# Patient Record
Sex: Male | Born: 1985 | Race: White | Hispanic: No | Marital: Single | Smoking: Former smoker
Health system: Southern US, Community
[De-identification: ages and names within clinical notes are randomized; demographics above are authoritative.]

## PROBLEM LIST (undated history)

## (undated) DIAGNOSIS — J45909 Unspecified asthma, uncomplicated: Secondary | ICD-10-CM

## (undated) DIAGNOSIS — J189 Pneumonia, unspecified organism: Secondary | ICD-10-CM

## (undated) HISTORY — PX: TONSILLECTOMY: SUR1361

## (undated) HISTORY — PX: HERNIA REPAIR: SHX51

## (undated) HISTORY — PX: JOINT REPLACEMENT: SHX530

## (undated) HISTORY — PX: EYE SURGERY: SHX253

---
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
## 2005-08-09 ENCOUNTER — Ambulatory Visit (HOSPITAL_COMMUNITY): Admission: RE | Admit: 2005-08-09 | Discharge: 2005-08-09 | Payer: Self-pay | Admitting: Neurology

## 2008-05-08 ENCOUNTER — Emergency Department (HOSPITAL_COMMUNITY): Admission: EM | Admit: 2008-05-08 | Discharge: 2008-05-08 | Payer: Self-pay | Admitting: Emergency Medicine

## 2010-06-21 ENCOUNTER — Encounter: Admission: RE | Admit: 2010-06-21 | Discharge: 2010-06-21 | Payer: Self-pay | Admitting: Orthopedic Surgery

## 2014-12-01 ENCOUNTER — Emergency Department (HOSPITAL_COMMUNITY): Payer: Managed Care, Other (non HMO)

## 2014-12-01 ENCOUNTER — Encounter (HOSPITAL_COMMUNITY): Payer: Self-pay | Admitting: Emergency Medicine

## 2014-12-01 ENCOUNTER — Inpatient Hospital Stay (HOSPITAL_COMMUNITY)
Admission: EM | Admit: 2014-12-01 | Discharge: 2014-12-06 | DRG: 871 | Disposition: A | Discharge status: Home / Self Care | Payer: Managed Care, Other (non HMO) | Attending: Internal Medicine | Admitting: Internal Medicine

## 2014-12-01 DIAGNOSIS — E871 Hypo-osmolality and hyponatremia: Secondary | ICD-10-CM | POA: Diagnosis present

## 2014-12-01 DIAGNOSIS — J189 Pneumonia, unspecified organism: Secondary | ICD-10-CM | POA: Diagnosis present

## 2014-12-01 DIAGNOSIS — A419 Sepsis, unspecified organism: Secondary | ICD-10-CM | POA: Diagnosis not present

## 2014-12-01 DIAGNOSIS — R0600 Dyspnea, unspecified: Secondary | ICD-10-CM | POA: Diagnosis not present

## 2014-12-01 DIAGNOSIS — R6510 Systemic inflammatory response syndrome (SIRS) of non-infectious origin without acute organ dysfunction: Secondary | ICD-10-CM | POA: Diagnosis present

## 2014-12-01 DIAGNOSIS — E876 Hypokalemia: Secondary | ICD-10-CM | POA: Diagnosis present

## 2014-12-01 DIAGNOSIS — H109 Unspecified conjunctivitis: Secondary | ICD-10-CM | POA: Diagnosis present

## 2014-12-01 DIAGNOSIS — E860 Dehydration: Secondary | ICD-10-CM | POA: Diagnosis present

## 2014-12-01 DIAGNOSIS — F17210 Nicotine dependence, cigarettes, uncomplicated: Secondary | ICD-10-CM | POA: Diagnosis present

## 2014-12-01 DIAGNOSIS — R000 Tachycardia, unspecified: Secondary | ICD-10-CM

## 2014-12-01 DIAGNOSIS — R0602 Shortness of breath: Secondary | ICD-10-CM

## 2014-12-01 HISTORY — DX: Unspecified asthma, uncomplicated: J45.909

## 2014-12-01 HISTORY — DX: Pneumonia, unspecified organism: J18.9

## 2014-12-01 LAB — BASIC METABOLIC PANEL
ANION GAP: 19 — AB (ref 5–15)
BUN: 11 mg/dL (ref 6–23)
CO2: 20 meq/L (ref 19–32)
CREATININE: 1.1 mg/dL (ref 0.50–1.35)
Calcium: 9.5 mg/dL (ref 8.4–10.5)
Chloride: 96 mEq/L (ref 96–112)
GFR calc non Af Amer: >90 mL/min (ref >90)
Glucose, Bld: 98 mg/dL (ref 70–99)
Potassium: 3.6 mEq/L — ABNORMAL LOW (ref 3.7–5.3)
Sodium: 135 mEq/L — ABNORMAL LOW (ref 137–147)

## 2014-12-01 LAB — URINALYSIS, ROUTINE W REFLEX MICROSCOPIC
BILIRUBIN URINE: NEGATIVE
Glucose, UA: NEGATIVE mg/dL
Hgb urine dipstick: NEGATIVE
Ketones, ur: 15 mg/dL — AB
LEUKOCYTES UA: NEGATIVE
NITRITE: NEGATIVE
PH: 5.5 (ref 5.0–8.0)
Protein, ur: 30 mg/dL — AB
UROBILINOGEN UA: 1 mg/dL (ref 0.0–1.0)

## 2014-12-01 LAB — CBC WITH DIFFERENTIAL/PLATELET
BASOS PCT: 0 % (ref 0–1)
Basophils Absolute: 0 10*3/uL (ref 0.0–0.1)
Eosinophils Absolute: 0 10*3/uL (ref 0.0–0.7)
Eosinophils Relative: 0 % (ref 0–5)
HCT: 46.4 % (ref 39.0–52.0)
HEMOGLOBIN: 16 g/dL (ref 13.0–17.0)
Lymphocytes Relative: 15 % (ref 12–46)
Lymphs Abs: 0.8 10*3/uL (ref 0.7–4.0)
MCH: 28.5 pg (ref 26.0–34.0)
MCHC: 34.5 g/dL (ref 30.0–36.0)
MCV: 82.6 fL (ref 78.0–100.0)
MONO ABS: 0.5 10*3/uL (ref 0.1–1.0)
MONOS PCT: 10 % (ref 3–12)
NEUTROS PCT: 75 % (ref 43–77)
Neutro Abs: 3.9 10*3/uL (ref 1.7–7.7)
Platelets: 251 10*3/uL (ref 150–400)
RBC: 5.62 MIL/uL (ref 4.22–5.81)
RDW: 13.1 % (ref 11.5–15.5)
WBC: 5.3 10*3/uL (ref 4.0–10.5)

## 2014-12-01 LAB — RAPID HIV SCREEN (WH-MAU): SUDS RAPID HIV SCREEN: NONREACTIVE

## 2014-12-01 LAB — PROCALCITONIN: PROCALCITONIN: 0.16 ng/mL

## 2014-12-01 LAB — PRO B NATRIURETIC PEPTIDE: Pro B Natriuretic peptide (BNP): 14.6 pg/mL (ref 0–125)

## 2014-12-01 LAB — URINE MICROSCOPIC-ADD ON

## 2014-12-01 LAB — HIV ANTIBODY (ROUTINE TESTING W REFLEX): HIV 1&2 Ab, 4th Generation: NONREACTIVE

## 2014-12-01 LAB — I-STAT CG4 LACTIC ACID, ED: Lactic Acid, Venous: 1.51 mmol/L (ref 0.5–2.2)

## 2014-12-01 MED ORDER — LACTATED RINGERS IV SOLN: INTRAVENOUS | Status: DC

## 2014-12-01 MED ORDER — ONDANSETRON HCL 4 MG/2ML IJ SOLN
4.0000 mg | Freq: Once | INTRAMUSCULAR | Status: AC
  Administered 2014-12-01: 4 mg via INTRAVENOUS
  Filled 2014-12-01: qty 2

## 2014-12-01 MED ORDER — ENOXAPARIN SODIUM 40 MG/0.4ML ~~LOC~~ SOLN
40.0000 mg | SUBCUTANEOUS | Status: DC
  Administered 2014-12-01 – 2014-12-04 (×4): 40 mg via SUBCUTANEOUS
  Filled 2014-12-01 (×5): qty 0.4

## 2014-12-01 MED ORDER — POTASSIUM CHLORIDE CRYS ER 20 MEQ PO TBCR
40.0000 meq | EXTENDED_RELEASE_TABLET | Freq: Once | ORAL | Status: AC
  Administered 2014-12-01: 40 meq via ORAL
  Filled 2014-12-01: qty 2

## 2014-12-01 MED ORDER — DEXTROSE 5 % IV SOLN
500.0000 mg | INTRAVENOUS | Status: DC
  Administered 2014-12-02 – 2014-12-05 (×4): 500 mg via INTRAVENOUS
  Filled 2014-12-01 (×5): qty 500

## 2014-12-01 MED ORDER — CEFTRIAXONE SODIUM IN DEXTROSE 20 MG/ML IV SOLN
1.0000 g | INTRAVENOUS | Status: DC
  Administered 2014-12-02 – 2014-12-05 (×4): 1 g via INTRAVENOUS
  Filled 2014-12-01 (×5): qty 50

## 2014-12-01 MED ORDER — GUAIFENESIN-DM 100-10 MG/5ML PO SYRP
5.0000 mL | ORAL_SOLUTION | ORAL | Status: DC | PRN
  Administered 2014-12-02 – 2014-12-05 (×11): 5 mL via ORAL
  Filled 2014-12-01 (×12): qty 10

## 2014-12-01 MED ORDER — IPRATROPIUM BROMIDE 0.02 % IN SOLN
0.5000 mg | RESPIRATORY_TRACT | Status: DC | PRN
Start: 2014-12-01 — End: 2014-12-05

## 2014-12-01 MED ORDER — BENZONATATE 100 MG PO CAPS
200.0000 mg | ORAL_CAPSULE | Freq: Three times a day (TID) | ORAL | Status: DC
  Administered 2014-12-01 – 2014-12-06 (×14): 200 mg via ORAL
  Filled 2014-12-01 (×21): qty 2

## 2014-12-01 MED ORDER — MENTHOL 3 MG MT LOZG
1.0000 | LOZENGE | OROMUCOSAL | Status: DC | PRN
  Filled 2014-12-01 (×4): qty 9

## 2014-12-01 MED ORDER — LEVALBUTEROL HCL 0.63 MG/3ML IN NEBU
0.6300 mg | INHALATION_SOLUTION | RESPIRATORY_TRACT | Status: DC | PRN
  Administered 2014-12-04: 0.63 mg via RESPIRATORY_TRACT
  Filled 2014-12-01: qty 3

## 2014-12-01 MED ORDER — SODIUM CHLORIDE 0.9 % IV BOLUS (SEPSIS)
1000.0000 mL | Freq: Once | INTRAVENOUS | Status: AC
  Administered 2014-12-01: 1000 mL via INTRAVENOUS

## 2014-12-01 MED ORDER — ACETAMINOPHEN 325 MG PO TABS
650.0000 mg | ORAL_TABLET | Freq: Four times a day (QID) | ORAL | Status: DC | PRN
  Administered 2014-12-01: 650 mg via ORAL
  Filled 2014-12-01: qty 2

## 2014-12-01 MED ORDER — SODIUM CHLORIDE 0.9 % IV SOLN
INTRAVENOUS | Status: DC
  Administered 2014-12-01 – 2014-12-05 (×6): via INTRAVENOUS

## 2014-12-01 MED ORDER — AZITHROMYCIN 250 MG PO TABS
500.0000 mg | ORAL_TABLET | Freq: Once | ORAL | Status: AC
  Administered 2014-12-01: 500 mg via ORAL
  Filled 2014-12-01: qty 2

## 2014-12-01 MED ORDER — IOHEXOL 350 MG/ML SOLN
100.0000 mL | Freq: Once | INTRAVENOUS | Status: AC | PRN
  Administered 2014-12-01: 100 mL via INTRAVENOUS

## 2014-12-01 MED ORDER — ACETAMINOPHEN 325 MG PO TABS
650.0000 mg | ORAL_TABLET | ORAL | Status: DC | PRN
  Administered 2014-12-01 – 2014-12-06 (×16): 650 mg via ORAL
  Filled 2014-12-01 (×17): qty 2

## 2014-12-01 MED ORDER — IPRATROPIUM BROMIDE 0.02 % IN SOLN
0.5000 mg | Freq: Four times a day (QID) | RESPIRATORY_TRACT | Status: DC
  Administered 2014-12-02: 0.5 mg via RESPIRATORY_TRACT
  Filled 2014-12-01 (×2): qty 2.5

## 2014-12-01 MED ORDER — DEXTROSE 5 % IV SOLN
1.0000 g | Freq: Once | INTRAVENOUS | Status: AC
  Administered 2014-12-01: 1 g via INTRAVENOUS
  Filled 2014-12-01: qty 10

## 2014-12-01 MED ORDER — IPRATROPIUM-ALBUTEROL 0.5-2.5 (3) MG/3ML IN SOLN
3.0000 mL | Freq: Once | RESPIRATORY_TRACT | Status: AC
  Administered 2014-12-01: 3 mL via RESPIRATORY_TRACT
  Filled 2014-12-01: qty 3

## 2014-12-01 MED ORDER — LEVALBUTEROL HCL 0.63 MG/3ML IN NEBU
0.6300 mg | INHALATION_SOLUTION | Freq: Four times a day (QID) | RESPIRATORY_TRACT | Status: DC
  Administered 2014-12-02: 0.63 mg via RESPIRATORY_TRACT
  Filled 2014-12-01 (×2): qty 3

## 2014-12-01 NOTE — H&P (Signed)
History and Physical       Hospital Admission Note Date: 12/01/2014  Patient name: Brian MurdochChristopher J Gosse Medical record number: [id number] Date of birth: December 15, 1986 Age: 28 y.o. Gender: male  PCP: No primary care provider on file.    Chief Complaint:  Fevers with productive cough for last 4-5 days  HPI: Patient is a 28 year old male with no syncope and past medical history was [facility] for the above symptoms. History was obtained from the patient who reported that his symptoms started 4-5 days ago when he noticed fatigue, generalized weakness. Subsequently he started developing subjective fevers and chills, productive cough yellowish to greenish sputum. He has not been eating and has poor appetite. He also complained of shortness of breath and right-sided pleuritic chest pain. Patient denied any recent sick contacts, recent travels, any rashes or tick bites. Patient has 2083-month-old baby and reports that he received all the vaccinations prior to the baby. He is not sure but thinks that he did receive a flu shot as well.  [facility] and was diagnosed with right lung pneumonia and was sent to the ER. Patient was noticed to be tachycardiac with heart rate in 130s EKGs was obtained which showed sinus tachycardia, CT chest was done, ruled out for acute PE however showed extensive consolidation in the right lower lung.   Review of Systems:  Constitutional: +fever, chills, diaphoresis, poor appetite and fatigue.  HEENT: Denies photophobia, eye pain, redness, hearing loss, ear pain, sneezing, mouth sores, trouble swallowing, neck pain, neck stiffness and tinnitus.  + congestion with rhinorrhea, sore throat Respiratory: Please see history of present illness Cardiovascular: Denies leg swelling. + tachycardia with pleuritic right-sided chest pain Gastrointestinal: Denies nausea, vomiting,  abdominal pain, constipation, blood in stool and abdominal distention. + had couple of episodes of diarrhea not currently Genitourinary: Denies dysuria, urgency, frequency, hematuria, flank pain and difficulty urinating.  Musculoskeletal: Denies back pain, joint swelling, arthralgias and gait problem. + myalgias Skin: Denies pallor, rash and wound.  Neurological: Denies dizziness, seizures, syncope, light-headedness, numbness and headaches. + generalized weakness Hematological: Denies adenopathy. Easy bruising, personal or family bleeding history  Psychiatric/Behavioral: Denies suicidal ideation, mood changes, confusion, nervousness, sleep disturbance and agitation  Past Medical History: History reviewed. No pertinent past medical history. History reviewed. No pertinent past surgical history.  Medications: Prior to Admission medications   Medication Sig Start Date End Date Taking? Authorizing Provider  acetaminophen (TYLENOL) 500 MG tablet Take 500 mg by mouth every 6 (six) hours as needed for fever.   Yes Historical Provider, MD  aspirin-acetaminophen-caffeine (EXCEDRIN MIGRAINE) 3151965693250-250-65 MG per tablet Take 1 tablet by mouth every 6 (six) hours as needed for headache.   Yes Historical Provider, MD  DM-Doxylamine-Acetaminophen (VICKS NYQUIL COLD & FLU) 15-6.25-325 MG CAPS Take 2 capsules by mouth every 6 (six) hours.   Yes Historical Provider, MD    Allergies:   Allergies  Allergen Reactions  . Other     Muscle relaxers: Make me angry  . Penicillins     Social History:  reports that he has been smoking.  He does not have any smokeless tobacco history on file. He reports that he does not drink alcohol or use illicit drugs.  Family History: History reviewed. No pertinent family history.  Physical Exam: Blood pressure 139/86, pulse 114, temperature 100.7 F (38.2 C), temperature source Oral, resp. rate 23, SpO2 99 %. General: Alert, awake, oriented x3, in no acute  distress,  diaphoretic. HEENT: normocephalic, atraumatic, anicteric sclera, pink conjunctiva, pupils equal and reactive to light and accomodation, oropharynx clear Neck: supple, no masses or lymphadenopathy, no goiter, no bruits  Heart: Regular rate and rhythm, without murmurs, rubs or gallops. Lungs: Right lower lung crackles, no wheezing Abdomen: Soft, nontender, nondistended, positive bowel sounds, no masses. Extremities: No clubbing, cyanosis or edema with positive pedal pulses. Neuro: Grossly intact, no focal neurological deficits, strength 5/5 upper and lower extremities bilaterally Psych: alert and oriented x 3, normal mood and affect Skin: no rashes or lesions, warm and dry   LABS on Admission:  Basic Metabolic Panel:  Recent Labs Lab 12/01/14 1354  NA 135*  K 3.6*  CL 96  CO2 20  GLUCOSE 98  BUN 11  CREATININE 1.10  CALCIUM 9.5   Liver Function Tests: No results for input(s): AST, ALT, ALKPHOS, BILITOT, PROT, ALBUMIN in the last 168 hours. No results for input(s): LIPASE, AMYLASE in the last 168 hours. No results for input(s): AMMONIA in the last 168 hours. CBC:  Recent Labs Lab 12/01/14 1354  WBC 5.3  NEUTROABS 3.9  HGB 16.0  HCT 46.4  MCV 82.6  PLT 251   Cardiac Enzymes: No results for input(s): CKTOTAL, CKMB, CKMBINDEX, TROPONINI in the last 168 hours. BNP: Invalid input(s): POCBNP CBG: No results for input(s): GLUCAP in the last 168 hours.   Radiological Exams on Admission: Dg Chest 2 View (if Patient Has Fever And/or Copd)  12/01/2014   CLINICAL DATA:  Progressive cough, shortness breath, and chest pain.  EXAM: CHEST  2 VIEW  COMPARISON:  None.  FINDINGS: The heart size is normal. Lung volumes are low. Medial right lower lobe airspace disease is concerning for pneumonia. Left basilar airspace disease is less prominent, likely atelectasis. The upper lung fields are clear. Mild pulmonary vascular congestion is evident. Surgical clips are noted over the right  axilla.  IMPRESSION: 1. Medial right lower lobe airspace disease is concerning for pneumonia. 2. Low lung volumes with mild left basilar airspace disease, likely atelectasis.   Electronically Signed   By: Gennette Pac M.D.   On: 12/01/2014 13:51   Ct Angio Chest Pe W/cm &/or Wo Cm  12/01/2014   CLINICAL DATA:  Fever.  Short of breath.  EXAM: CT ANGIOGRAPHY CHEST WITH CONTRAST  TECHNIQUE: Multidetector CT imaging of the chest was performed using the standard protocol during bolus administration of intravenous contrast. Multiplanar CT image reconstructions and MIPs were obtained to evaluate the vascular anatomy.  CONTRAST:  OMNIPAQUE IOHEXOL 350 MG/ML SOLN  COMPARISON:  None.  FINDINGS: There are no filling defects in the pulmonary arterial tree to suggest acute pulmonary thromboembolism  No pericardial effusion.  No pneumothorax or pleural effusion  Minimal patchy density at the base of the lingula.  There is extensive consolidation in the right lower lobe. Some air bronchograms are present. There are other areas were there are no air bronchograms. Overall density is somewhat less than expected for consolidation. Right infrahilar adenopathy is suspected on image 46. 10 mm subcarinal lymph node.  No destructive bone lesion.  Review of the MIP images confirms the above findings.  IMPRESSION: No evidence of acute pulmonary thromboembolism  There is extensive consolidation in the right lower lobe. It is has a somewhat unusual appearance with less than expected air bronchograms and low density. It is also associated with mild right hilar and subcarinal adenopathy. Malignancy would be unusual in this age group. Eight should still be considered.  Opportunistic infection such as mycobacterium species and fungal disorder should be suspected. Follow-up imaging by radiographs until complete resolution is recommended. If abnormalities fail to resolve, subsequent PET-CT may be helpful.  Mild patchy density at the base  of the lingula.   Electronically Signed   By: Maryclare BeanArt  Hoss M.D.   On: 12/01/2014 15:47    Assessment/Plan Principal Problem:  SIRS secondary to CAP (community acquired pneumonia): With fever, tachycardia, tachypnea with pneumonia on the CT chest - Admit to telemetry, lower threshold to SDU if any worsening - Obtain 2 sets of blood cultures, pro-calcitonin, influenza panel, rapid strep throat screen, HIV, UA&Cx, urine legionella antigen, urine strep antigen. Lactic acid 1.5 - Placed on IV Zithromax, IV Rocephin. Will need Tamiflu if positive for influenza panel.  Active Problems: Dehydration - Placed on aggressive IV fluid hydration   Hyponatremia with Hypokalemia - Continue IV fluid hydration, potassium replaced     Tachycardia - Likely due to #1, CT angiogram of the chest negative for PE  DVT prophylaxis: Lovenox  CODE STATUS: Full code  Family Communication: Admission, patients condition and plan of care including tests being ordered have been discussed with the patient and mother  who indicates understanding and agree with the plan and Code Status   Further plan will depend as patient's clinical course evolves and further radiologic and laboratory data become available.   Time Spent on Admission: 1 hour  Gaylin Bulthuis M.D. [facility] 12/01/2014, 4:40 PM Pager: [phone]  If 7PM-7AM, please contact night-coverage [email or web address] Password [email or web address]

## 2014-12-01 NOTE — Progress Notes (Signed)
Utilization Review completed.  Caprice Wasko RN CM  

## 2014-12-01 NOTE — ED Notes (Addendum)
Pt sent [facility]. Was diagnosed with R lower pneumonia. Has been running fever at home, took tylenol prior to arrival. Feels SOB.

## 2014-12-02 LAB — BASIC METABOLIC PANEL
Anion gap: 15 (ref 5–15)
BUN: 10 mg/dL (ref 6–23)
CO2: 22 mEq/L (ref 19–32)
CREATININE: 1.07 mg/dL (ref 0.50–1.35)
Calcium: 8.6 mg/dL (ref 8.4–10.5)
Chloride: 99 mEq/L (ref 96–112)
GFR calc Af Amer: >90 mL/min (ref >90)
GFR calc non Af Amer: >90 mL/min (ref >90)
Glucose, Bld: 99 mg/dL (ref 70–99)
POTASSIUM: 3.6 meq/L — AB (ref 3.7–5.3)
Sodium: 136 mEq/L — ABNORMAL LOW (ref 137–147)

## 2014-12-02 LAB — CBC
HEMATOCRIT: 41 % (ref 39.0–52.0)
Hemoglobin: 14 g/dL (ref 13.0–17.0)
MCH: 28.8 pg (ref 26.0–34.0)
MCHC: 34.1 g/dL (ref 30.0–36.0)
MCV: 84.4 fL (ref 78.0–100.0)
Platelets: 240 10*3/uL (ref 150–400)
RBC: 4.86 MIL/uL (ref 4.22–5.81)
RDW: 13.4 % (ref 11.5–15.5)
WBC: 6.3 10*3/uL (ref 4.0–10.5)

## 2014-12-02 LAB — INFLUENZA PANEL BY PCR (TYPE A & B)
H1N1 flu by pcr: NOT DETECTED
Influenza A By PCR: NEGATIVE
Influenza B By PCR: NEGATIVE

## 2014-12-02 LAB — STREP PNEUMONIAE URINARY ANTIGEN: STREP PNEUMO URINARY ANTIGEN: NEGATIVE

## 2014-12-02 LAB — EXPECTORATED SPUTUM ASSESSMENT W GRAM STAIN, RFLX TO RESP C: Special Requests: NORMAL

## 2014-12-02 LAB — EXPECTORATED SPUTUM ASSESSMENT W REFEX TO RESP CULTURE

## 2014-12-02 LAB — RAPID STREP SCREEN (MED CTR MEBANE ONLY): Streptococcus, Group A Screen (Direct): NEGATIVE

## 2014-12-02 MED ORDER — POTASSIUM CHLORIDE CRYS ER 20 MEQ PO TBCR
40.0000 meq | EXTENDED_RELEASE_TABLET | Freq: Once | ORAL | Status: AC
  Administered 2014-12-02: 40 meq via ORAL
  Filled 2014-12-02: qty 2

## 2014-12-02 MED ORDER — HYDROCOD POLST-CHLORPHEN POLST 10-8 MG/5ML PO LQCR
5.0000 mL | Freq: Two times a day (BID) | ORAL | Status: DC | PRN
  Administered 2014-12-02 (×2): 5 mL via ORAL
  Filled 2014-12-02 (×3): qty 5

## 2014-12-02 MED ORDER — ONDANSETRON HCL 4 MG/2ML IJ SOLN
4.0000 mg | Freq: Four times a day (QID) | INTRAMUSCULAR | Status: DC | PRN
  Administered 2014-12-02 – 2014-12-04 (×2): 4 mg via INTRAVENOUS
  Filled 2014-12-02 (×2): qty 2

## 2014-12-02 NOTE — Progress Notes (Signed)
PT Cancellation Note  Patient Details Name: Brian MurdochChristopher J Friedman MRN: [id number] DOB: August 25, 1986   Cancelled Treatment:    Reason Eval/Treat Not Completed: PT screened, no needs identified, will sign off  Bayard Huggereresa K. Manson PasseyBrown, PT  12/02/2014, 12:06 PM

## 2014-12-02 NOTE — Progress Notes (Signed)
PATIENT DETAILS Name: Brian Friedman Age: 28 y.o. Sex: male Date of Birth: Dec 26, 1985 Admit Date: 12/01/2014 Admitting Physician Ripudeep Jenna Luo, MD PCP:No primary care provider on file.  Subjective: Still coughing, but feels slightly better than yesterday  Assessment/Plan: Principal Problem:   CAP (community acquired pneumonia): Overall improved, still febrile overnight. Continue with Rocephin/Zithromax. Await influenza PCR, blood cultures negative so far. Await urine streptococcal/Legionella antigen. Follow clinical course.  Active Problems:   SIRS (systemic inflammatory response syndrome): Secondary to above. Resolving with IV fluids and IV antibiotics.  Disposition: Remain inpatient  Antibiotics:  See below   Anti-infectives    Start     Dose/Rate Route Frequency Ordered Stop   12/02/14 1400  cefTRIAXone (ROCEPHIN) 1 g in dextrose 5 % 50 mL IVPB - Premix     1 g100 mL/hr over 30 Minutes Intravenous Every 24 hours 12/01/14 1718 12/08/14 1359   12/02/14 1400  azithromycin (ZITHROMAX) 500 mg in dextrose 5 % 250 mL IVPB     500 mg250 mL/hr over 60 Minutes Intravenous Every 24 hours 12/01/14 1718 12/08/14 1359   12/01/14 1400  cefTRIAXone (ROCEPHIN) 1 g in dextrose 5 % 50 mL IVPB     1 g100 mL/hr over 30 Minutes Intravenous  Once 12/01/14 1359 12/01/14 1539   12/01/14 1400  azithromycin (ZITHROMAX) tablet 500 mg     500 mg Oral  Once 12/01/14 1359 12/01/14 1424      DVT Prophylaxis: Prophylactic Lovenox   Code Status: Full code   Family Communication Mother at bedside  Procedures:  None  CONSULTS:  None  Time spent 40 minutes-which includes 50% of the time with face-to-face with patient/ family and coordinating care related to the above assessment and plan.  MEDICATIONS: Scheduled Meds: . azithromycin  500 mg Intravenous Q24H  . benzonatate  200 mg Oral TID  . cefTRIAXone (ROCEPHIN)  IV  1 g Intravenous Q24H  . enoxaparin (LOVENOX) injection   40 mg Subcutaneous Q24H   Continuous Infusions: . sodium chloride 125 mL/hr at 12/02/14 0210   PRN Meds:.acetaminophen, guaiFENesin-dextromethorphan, ipratropium, levalbuterol, menthol-cetylpyridinium    PHYSICAL EXAM: Vital signs in last 24 hours: Filed Vitals:   12/01/14 2100 12/02/14 0022 12/02/14 0518 12/02/14 0653  BP: 115/62  108/55   Pulse: 90  106   Temp: 98.8 F (37.1 C)  102.8 F (39.3 C) 99.8 F (37.7 C)  TempSrc: Oral  Oral Oral  Resp: 20  20   Height: 5\' 11"  (1.803 m)     Weight: 105.779 kg (233 lb 3.2 oz)     SpO2: 96% 97% 97%     Weight change:  Filed Weights   12/01/14 2100  Weight: 105.779 kg (233 lb 3.2 oz)   Body mass index is 32.54 kg/(m^2).   Gen Exam: Awake and alert with clear speech.   Neck: Supple, No JVD.   Chest: Right basal rales.   CVS: S1 S2 Regular, no murmurs.  Abdomen: soft, BS +, non tender, non distended.  Extremities: no edema, lower extremities warm to touch. Neurologic: Non Focal.   Skin: No Rash.   Wounds: N/A.   Intake/Output from previous day:  Intake/Output Summary (Last 24 hours) at 12/02/14 1112 Last data filed at 12/02/14 0700  Gross per 24 hour  Intake 2510.83 ml  Output      0 ml  Net 2510.83 ml     LAB RESULTS: CBC  Recent Labs Lab 12/01/14 1354 12/02/14  0403  WBC 5.3 6.3  HGB 16.0 14.0  HCT 46.4 41.0  PLT 251 240  MCV 82.6 84.4  MCH 28.5 28.8  MCHC 34.5 34.1  RDW 13.1 13.4  LYMPHSABS 0.8  --   MONOABS 0.5  --   EOSABS 0.0  --   BASOSABS 0.0  --     Chemistries   Recent Labs Lab 12/01/14 1354 12/02/14 0403  NA 135* 136*  K 3.6* 3.6*  CL 96 99  CO2 20 22  GLUCOSE 98 99  BUN 11 10  CREATININE 1.10 1.07  CALCIUM 9.5 8.6    CBG: No results for input(s): GLUCAP in the last 168 hours.  GFR Estimated Creatinine Clearance: 127.2 mL/min (by C-G formula based on Cr of 1.07).  Coagulation profile No results for input(s): INR, PROTIME in the last 168 hours.  Cardiac Enzymes No  results for input(s): CKMB, TROPONINI, MYOGLOBIN in the last 168 hours.  Invalid input(s): CK  Invalid input(s): POCBNP No results for input(s): DDIMER in the last 72 hours. No results for input(s): HGBA1C in the last 72 hours. No results for input(s): CHOL, HDL, LDLCALC, TRIG, CHOLHDL, LDLDIRECT in the last 72 hours. No results for input(s): TSH, T4TOTAL, T3FREE, THYROIDAB in the last 72 hours.  Invalid input(s): FREET3 No results for input(s): VITAMINB12, FOLATE, FERRITIN, TIBC, IRON, RETICCTPCT in the last 72 hours. No results for input(s): LIPASE, AMYLASE in the last 72 hours.  Urine Studies No results for input(s): UHGB, CRYS in the last 72 hours.  Invalid input(s): UACOL, UAPR, USPG, UPH, UTP, UGL, UKET, UBIL, UNIT, UROB, ULEU, UEPI, UWBC, URBC, UBAC, CAST, UCOM, BILUA  MICROBIOLOGY: Recent Results (from the past 240 hour(s))  Culture, blood (routine x 2) Call MD if unable to obtain prior to antibiotics being given     Status: None (Preliminary result)   Collection Time: 12/01/14  1:50 PM  Result Value Ref Range Status   Specimen Description BLOOD RIGHT WRIST  Final   Special Requests BOTTLES DRAWN AEROBIC AND ANAEROBIC 5CC  Final   Culture  Setup Time   Final    12/01/2014 22:41 Performed at [facility]    Culture   Final           BLOOD CULTURE RECEIVED NO GROWTH TO DATE CULTURE WILL BE HELD FOR 5 DAYS BEFORE ISSUING A FINAL NEGATIVE REPORT Performed at [facility]    Report Status PENDING  Incomplete  Culture, blood (routine x 2) Call MD if unable to obtain prior to antibiotics being given     Status: None (Preliminary result)   Collection Time: 12/01/14  6:20 PM  Result Value Ref Range Status   Specimen Description BLOOD LEFT ARM  Final   Special Requests BOTTLES DRAWN AEROBIC AND ANAEROBIC 10CC  Final   Culture  Setup Time   Final    12/01/2014 22:42 Performed at [facility]    Culture   Final           BLOOD CULTURE RECEIVED NO  GROWTH TO DATE CULTURE WILL BE HELD FOR 5 DAYS BEFORE ISSUING A FINAL NEGATIVE REPORT Performed at [facility]    Report Status PENDING  Incomplete  Rapid strep screen     Status: None   Collection Time: 12/02/14  1:11 AM  Result Value Ref Range Status   Streptococcus, Group A Screen (Direct) NEGATIVE NEGATIVE Final    Comment: (NOTE) A Rapid Antigen test may result negative if the antigen level  in the sample is below the detection level of this test. The FDA has not cleared this test as a stand-alone test therefore the rapid antigen negative result has reflexed to a Group A Strep culture.   Culture, sputum-assessment     Status: None   Collection Time: 12/02/14  2:11 AM  Result Value Ref Range Status   Specimen Description SPUTUM  Final   Special Requests Normal  Final   Sputum evaluation   Final    THIS SPECIMEN IS ACCEPTABLE. RESPIRATORY CULTURE REPORT TO FOLLOW.   Report Status 12/02/2014 FINAL  Final    RADIOLOGY STUDIES/RESULTS: Dg Chest 2 View (if Patient Has Fever And/or Copd)  12/01/2014   CLINICAL DATA:  Progressive cough, shortness breath, and chest pain.  EXAM: CHEST  2 VIEW  COMPARISON:  None.  FINDINGS: The heart size is normal. Lung volumes are low. Medial right lower lobe airspace disease is concerning for pneumonia. Left basilar airspace disease is less prominent, likely atelectasis. The upper lung fields are clear. Mild pulmonary vascular congestion is evident. Surgical clips are noted over the right axilla.  IMPRESSION: 1. Medial right lower lobe airspace disease is concerning for pneumonia. 2. Low lung volumes with mild left basilar airspace disease, likely atelectasis.   Electronically Signed   By: Gennette Pachris  Mattern M.D.   On: 12/01/2014 13:51   Ct Angio Chest Pe W/cm &/or Wo Cm  12/01/2014   CLINICAL DATA:  Fever.  Short of breath.  EXAM: CT ANGIOGRAPHY CHEST WITH CONTRAST  TECHNIQUE: Multidetector CT imaging of the chest was performed using the standard  protocol during bolus administration of intravenous contrast. Multiplanar CT image reconstructions and MIPs were obtained to evaluate the vascular anatomy.  CONTRAST:  100mL OMNIPAQUE IOHEXOL 350 MG/ML SOLN  COMPARISON:  None.  FINDINGS: There are no filling defects in the pulmonary arterial tree to suggest acute pulmonary thromboembolism  No pericardial effusion.  No pneumothorax or pleural effusion  Minimal patchy density at the base of the lingula.  There is extensive consolidation in the right lower lobe. Some air bronchograms are present. There are other areas were there are no air bronchograms. Overall density is somewhat less than expected for consolidation. Right infrahilar adenopathy is suspected on image 46. 10 mm subcarinal lymph node.  No destructive bone lesion.  Review of the MIP images confirms the above findings.  IMPRESSION: No evidence of acute pulmonary thromboembolism  There is extensive consolidation in the right lower lobe. It is has a somewhat unusual appearance with less than expected air bronchograms and low density. It is also associated with mild right hilar and subcarinal adenopathy. Malignancy would be unusual in this age group. Eight should still be considered. Opportunistic infection such as mycobacterium species and fungal disorder should be suspected. Follow-up imaging by radiographs until complete resolution is recommended. If abnormalities fail to resolve, subsequent PET-CT may be helpful.  Mild patchy density at the base of the lingula.   Electronically Signed   By: Maryclare BeanArt  Hoss M.D.   On: 12/01/2014 15:47    Jeoffrey MassedGHIMIRE,SHANKER, MD  [facility] Pager:[phone]  If 7PM-7AM, please contact night-coverage [email or web address] Password [email or web address] 12/02/2014, 11:12 AM   LOS: 1 day

## 2014-12-03 LAB — BASIC METABOLIC PANEL
Anion gap: 12 (ref 5–15)
BUN: 9 mg/dL (ref 6–23)
CALCIUM: 8.7 mg/dL (ref 8.4–10.5)
CO2: 23 meq/L (ref 19–32)
Chloride: 102 mEq/L (ref 96–112)
Creatinine, Ser: 1.2 mg/dL (ref 0.50–1.35)
GFR calc Af Amer: >90 mL/min (ref >90)
GFR calc non Af Amer: 81 mL/min — ABNORMAL LOW (ref >90)
GLUCOSE: 99 mg/dL (ref 70–99)
Potassium: 4.2 mEq/L (ref 3.7–5.3)
Sodium: 137 mEq/L (ref 137–147)

## 2014-12-03 LAB — URINE CULTURE: Special Requests: NORMAL

## 2014-12-03 LAB — CBC
HEMATOCRIT: 39.1 % (ref 39.0–52.0)
HEMOGLOBIN: 13.1 g/dL (ref 13.0–17.0)
MCH: 28.6 pg (ref 26.0–34.0)
MCHC: 33.5 g/dL (ref 30.0–36.0)
MCV: 85.4 fL (ref 78.0–100.0)
Platelets: 246 10*3/uL (ref 150–400)
RBC: 4.58 MIL/uL (ref 4.22–5.81)
RDW: 13.5 % (ref 11.5–15.5)
WBC: 6.9 10*3/uL (ref 4.0–10.5)

## 2014-12-03 LAB — PROCALCITONIN: Procalcitonin: 0.11 ng/mL

## 2014-12-03 MED ORDER — HYDROCOD POLST-CHLORPHEN POLST 10-8 MG/5ML PO LQCR
5.0000 mL | Freq: Two times a day (BID) | ORAL | Status: DC
  Administered 2014-12-03 – 2014-12-06 (×7): 5 mL via ORAL
  Filled 2014-12-03 (×6): qty 5

## 2014-12-03 NOTE — ED Provider Notes (Signed)
CSN: [id number]     Arrival date & time 12/01/14  1251 History   First MD Initiated Contact with Patient 12/01/14 1313     Chief Complaint  Patient presents with  . Pneumonia     (Consider location/radiation/quality/duration/timing/severity/associated sxs/prior Treatment) HPI Comments: Patient is a 28 year old male with no past medical history was sent from the [facility] for pneumonia evaluation. Pt started having cough and shortness of breath 4-5 days ago, and also started having fatigue, generalized weakness around the same time. Subsequently he started developing subjective fevers and chills, productive cough yellowish to greenish sputum. He has not been eating and has poor appetite. He also complained of shortness of breath and right-sided pleuritic chest pain. Patient denied any recent sick contacts, recent travels, any rashes or tick bites.  Patient is a 28 y.o. male presenting with pneumonia. The history is provided by the patient.  Pneumonia Associated symptoms include shortness of breath. Pertinent negatives include no chest pain and no abdominal pain.    Past Medical History  Diagnosis Date  . Asthma     as child  . Pneumonia     frist time   Past Surgical History  Procedure Laterality Date  . Joint replacement    . Hernia repair    . Tonsillectomy    . Eye surgery     Family History  Problem Relation Age of Onset  . Heart attack Father   . Heart murmur Brother    History  Substance Use Topics  . Smoking status: Former Smoker    Types: Cigarettes    Quit date: 05/01/2014  . Smokeless tobacco: Never Used  . Alcohol Use: No    Review of Systems  Constitutional: Positive for fever and chills. Negative for activity change and appetite change.  Respiratory: Positive for cough and shortness of breath.   Cardiovascular: Negative for chest pain.  Gastrointestinal: Negative for abdominal pain.  Genitourinary: Negative for dysuria.  Neurological:  Positive for weakness.  All other systems reviewed and are negative.     Allergies  Other and Penicillins  Home Medications   Prior to Admission medications   Medication Sig Start Date End Date Taking? Authorizing Provider  acetaminophen (TYLENOL) 500 MG tablet Take 500 mg by mouth every 6 (six) hours as needed for fever.   Yes Historical Provider, MD  aspirin-acetaminophen-caffeine (EXCEDRIN MIGRAINE) [phone] MG per tablet Take 1 tablet by mouth every 6 (six) hours as needed for headache.   Yes Historical Provider, MD  DM-Doxylamine-Acetaminophen (VICKS NYQUIL COLD & FLU) 15-6.25-325 MG CAPS Take 2 capsules by mouth every 6 (six) hours.   Yes Historical Provider, MD   BP 131/62 mmHg  Pulse 105  Temp(Src) 101 F (38.3 C) (Oral)  Resp 18  Ht 5\' 11"  (1.803 m)  Wt 233 lb 3.2 oz (105.779 kg)  BMI 32.54 kg/m2  SpO2 95% Physical Exam  Constitutional: He is oriented to person, place, and time. He appears well-developed.  HENT:  Head: Normocephalic and atraumatic.  Eyes: Conjunctivae and EOM are normal. Pupils are equal, round, and reactive to light.  Neck: Normal range of motion. Neck supple. No JVD present.  Cardiovascular: Regular rhythm.   No murmur heard. Pulmonary/Chest: Effort normal and breath sounds normal. He has no wheezes. He has no rales.  Abdominal: Soft. Bowel sounds are normal. He exhibits no distension. There is no tenderness. There is no rebound and no guarding.  Neurological: He is alert and oriented to person, place, and  time.  Skin: Skin is warm.  Nursing note and vitals reviewed.   ED Course  Procedures (including critical care time) Labs Review Labs Reviewed  BASIC METABOLIC PANEL - Abnormal; Notable for the following:    Sodium 135 (*)    Potassium 3.6 (*)    Anion gap 19 (*)    All other components within normal limits  URINALYSIS, ROUTINE W REFLEX MICROSCOPIC - Abnormal; Notable for the following:    Specific Gravity, Urine >1.046 (*)     Ketones, ur 15 (*)    Protein, ur 30 (*)    All other components within normal limits  BASIC METABOLIC PANEL - Abnormal; Notable for the following:    Sodium 136 (*)    Potassium 3.6 (*)    All other components within normal limits  BASIC METABOLIC PANEL - Abnormal; Notable for the following:    GFR calc non Af Amer 81 (*)    All other components within normal limits  RAPID STREP SCREEN  CULTURE, BLOOD (ROUTINE X 2)  CULTURE, BLOOD (ROUTINE X 2)  CULTURE, EXPECTORATED SPUTUM-ASSESSMENT  URINE CULTURE  CULTURE, RESPIRATORY (NON-EXPECTORATED)  CULTURE, GROUP A STREP  CBC WITH DIFFERENTIAL  PRO B NATRIURETIC PEPTIDE  RAPID HIV SCREEN ([facility]-MAU)  INFLUENZA PANEL BY PCR (TYPE A & B, H1N1)  PROCALCITONIN  HIV ANTIBODY (ROUTINE TESTING)  STREP PNEUMONIAE URINARY ANTIGEN  CBC  URINE MICROSCOPIC-ADD ON  PROCALCITONIN  CBC  LEGIONELLA ANTIGEN, URINE  I-STAT CG4 LACTIC ACID, ED    Imaging Review No results found.   EKG Interpretation   Date/Time:  Friday December 01 2014 13:09:09 EST Ventricular Rate:  144 PR Interval:  122 QRS Duration: 89 QT Interval:  288 QTC Calculation: 446 R Axis:   88 Text Interpretation:  Sinus tachycardia RSR' in V1 or V2, probably normal  variant Baseline wander in lead(s) V6 s1q3t3 appreciated No old ekg  Confirmed by Rhunette CroftNANAVATI, MD, Janey GentaANKIT [phone] on 12/01/2014 1:50:07 PM      MDM   Final diagnoses:  Dyspnea  CAP (community acquired pneumonia)  Sepsis  Pt comes in with cc of dyspnea, chest pain, cough, fevers. He has 2 SIRs at arrival - and is noted to be extremely tachycardic, and feelign week. EKG does show some right sided strain, pain is pleuritic and there is no WC elevation - which made us consider the cause of his significant sx as possible PE or pulm infarct. CT PE is neg for the above however.  Will admit as CAP.  Derwood KaplanAnkit Lashanda Storlie, MD 12/03/14 [phone]

## 2014-12-03 NOTE — Progress Notes (Signed)
TRH Progress Note        PATIENT DETAILS Name: Brian Friedman Age: 28 y.o. Sex: male Date of Birth: 01/12/1986 Admit Date: 12/01/2014 Admitting Physician Ripudeep Jenna LuoK Rai, MD PCP:No primary care provider on file.  HPI 28 year old male with no syncope and past medical history was sent from the [facility] care for fevers and productive cough 4-5 days prior to admission, found to have and admitted with PNA.  Subjective: Still coughing, but feels slightly better than yesterday  Assessment/Plan:  CAP (community acquired pneumonia):  - continues to be febrile, clinically he is improving. - continue IV Antibiotics today - influenza negative.  - strep pneumo negative, legionella pending  Sepsis  - present on admission with SIRS (systemic inflammatory response syndrome) and a source:  - Secondary to CAP, improving  Disposition: Remain inpatient  DVT Prophylaxis: Lovenox  Code Status: Full code  Diet: Regular Family Communication Mother at bedside  Antibiotics:  Ceftriaxone 12/11 >>  Azithromycin 12/11 >>  Procedures:  None  CONSULTS: None  PHYSICAL EXAM: Vital signs in last 24 hours: Filed Vitals:   12/02/14 1456 12/02/14 1615 12/02/14 2020 12/02/14 2100  BP: 122/67  133/69 123/65  Pulse: 94  102 97  Temp: 99.7 F (37.6 C) 102.9 F (39.4 C) 102.9 F (39.4 C) 99.9 F (37.7 C)  TempSrc: Oral Oral Oral Oral  Resp: 20  20 20   Height:      Weight:      SpO2: 97%  94% 97%    Weight change:  Filed Weights   12/01/14 2100  Weight: 105.779 kg (233 lb 3.2 oz)   Body mass index is 32.54 kg/(m^2).   Gen Exam: Awake and alert with clear speech.   Neck: Supple, No JVD.   Chest: Right basal rales.   CV: RRR, no MRG Abdomen: soft, BS +, non tender Extremities: no edema Skin: No Rash.    Intake/Output from previous day:  Intake/Output Summary (Last 24 hours) at 12/03/14 0823 Last data filed at 12/03/14 0700  Gross per 24 hour  Intake    1860 ml  Output   1300 ml  Net    560 ml   LAB RESULTS: CBC  Recent Labs Lab 12/01/14 1354 12/02/14 0403 12/03/14 0440  WBC 5.3 6.3 6.9  HGB 16.0 14.0 13.1  HCT 46.4 41.0 39.1  PLT 251 240 246  MCV 82.6 84.4 85.4  MCH 28.5 28.8 28.6  MCHC 34.5 34.1 33.5  RDW 13.1 13.4 13.5  LYMPHSABS 0.8  --   --   MONOABS 0.5  --   --   EOSABS 0.0  --   --   BASOSABS 0.0  --   --    Chemistries   Recent Labs Lab 12/01/14 1354 12/02/14 0403 12/03/14 0440  NA 135* 136* 137  K 3.6* 3.6* 4.2  CL 96 99 102  CO2 20 22 23   GLUCOSE 98 99 99  BUN 11 10 9   CREATININE 1.10 1.07 1.20  CALCIUM 9.5 8.6 8.7   GFR Estimated Creatinine Clearance: 113.4 mL/min (by C-G formula based on Cr of 1.2).  MICROBIOLOGY: Recent Results (from the past 240 hour(s))  Culture, blood (routine x 2) Call MD if unable to obtain prior to antibiotics being given     Status: None (Preliminary result)   Collection Time: 12/01/14  1:50 PM  Result Value Ref Range Status   Specimen Description BLOOD RIGHT WRIST  Final   Special Requests BOTTLES DRAWN AEROBIC  AND ANAEROBIC 5CC  Final   Culture  Setup Time   Final    12/01/2014 22:41 Performed at [facility]    Culture   Final           BLOOD CULTURE RECEIVED NO GROWTH TO DATE CULTURE WILL BE HELD FOR 5 DAYS BEFORE ISSUING A FINAL NEGATIVE REPORT Performed at [facility]    Report Status PENDING  Incomplete  Culture, blood (routine x 2) Call MD if unable to obtain prior to antibiotics being given     Status: None (Preliminary result)   Collection Time: 12/01/14  6:20 PM  Result Value Ref Range Status   Specimen Description BLOOD LEFT ARM  Final   Special Requests BOTTLES DRAWN AEROBIC AND ANAEROBIC 10CC  Final   Culture  Setup Time   Final    12/01/2014 22:42 Performed at [facility]    Culture   Final           BLOOD CULTURE RECEIVED NO GROWTH TO DATE CULTURE WILL BE HELD FOR 5 DAYS BEFORE ISSUING A FINAL NEGATIVE  REPORT Performed at [facility]    Report Status PENDING  Incomplete  Rapid strep screen     Status: None   Collection Time: 12/02/14  1:11 AM  Result Value Ref Range Status   Streptococcus, Group A Screen (Direct) NEGATIVE NEGATIVE Final    Comment: (NOTE) A Rapid Antigen test may result negative if the antigen level in the sample is below the detection level of this test. The FDA has not cleared this test as a stand-alone test therefore the rapid antigen negative result has reflexed to a Group A Strep culture.   Culture, sputum-assessment     Status: None   Collection Time: 12/02/14  2:11 AM  Result Value Ref Range Status   Specimen Description SPUTUM  Final   Special Requests Normal  Final   Sputum evaluation   Final    THIS SPECIMEN IS ACCEPTABLE. RESPIRATORY CULTURE REPORT TO FOLLOW.   Report Status 12/02/2014 FINAL  Final    RADIOLOGY STUDIES/RESULTS: Dg Chest 2 View (if Patient Has Fever And/or Copd) 12/01/2014  1. Medial right lower lobe airspace disease is concerning for pneumonia. 2. Low lung volumes with mild left basilar airspace disease, likely atelectasis.   Ct Angio Chest Pe W/cm &/or Wo Cm 12/01/2014 No evidence of acute pulmonary thromboembolism  There is extensive consolidation in the right lower lobe. It is has a somewhat unusual appearance with less than expected air bronchograms and low density. It is also associated with mild right hilar and subcarinal adenopathy. Malignancy would be unusual in this age group. Eight should still be considered. Opportunistic infection such as mycobacterium species and fungal disorder should be suspected. Follow-up imaging by radiographs until complete resolution is recommended. If abnormalities fail to resolve, subsequent PET-CT may be helpful.  Mild patchy density at the base of the lingula.   Medications: Scheduled Meds: . azithromycin  500 mg Intravenous Q24H  . benzonatate  200 mg Oral TID  . cefTRIAXone (ROCEPHIN)   IV  1 g Intravenous Q24H  . chlorpheniramine-HYDROcodone  5 mL Oral Q12H  . enoxaparin (LOVENOX) injection  40 mg Subcutaneous Q24H   Continuous Infusions: . sodium chloride 125 mL/hr at 12/02/14 0210   PRN Meds:.acetaminophen, guaiFENesin-dextromethorphan, ipratropium, levalbuterol, menthol-cetylpyridinium, ondansetron (ZOFRAN) IV   Time spent 25 minutes    Pamella Pert, MD  [facility] Pager:[phone]  If 7PM-7AM, please contact night-coverage [email or web address]  Password [email or web address] 12/03/2014, 8:23 AM   LOS: 2 days

## 2014-12-04 ENCOUNTER — Inpatient Hospital Stay (HOSPITAL_COMMUNITY): Payer: Managed Care, Other (non HMO)

## 2014-12-04 DIAGNOSIS — R509 Fever, unspecified: Secondary | ICD-10-CM

## 2014-12-04 DIAGNOSIS — H109 Unspecified conjunctivitis: Secondary | ICD-10-CM

## 2014-12-04 LAB — CULTURE, GROUP A STREP

## 2014-12-04 MED ORDER — NAPHAZOLINE HCL 0.1 % OP SOLN
1.0000 [drp] | Freq: Four times a day (QID) | OPHTHALMIC | Status: DC | PRN
  Administered 2014-12-04: 1 [drp] via OPHTHALMIC
  Filled 2014-12-04: qty 15

## 2014-12-04 MED ORDER — ZOLPIDEM TARTRATE 5 MG PO TABS
5.0000 mg | ORAL_TABLET | Freq: Once | ORAL | Status: AC
  Administered 2014-12-04: 5 mg via ORAL
  Filled 2014-12-04: qty 1

## 2014-12-04 NOTE — Progress Notes (Signed)
TRH Progress Note        PATIENT DETAILS Name: Brian Friedman Age: 28 y.o. Sex: male Date of Birth: December 31, 1985 Admit Date: 12/01/2014 Admitting Physician Ripudeep Jenna Luo, MD PCP:No primary care provider on file.  HPI 28 year old male with no syncope and past medical history was sent from the [facility] urgent care for fevers and productive cough 4-5 days prior to admission, found to have and admitted with PNA.  Subjective: Continues to be febrile, he is feeling better but still with intermittent chills Complains of a right pink eye this morning  Assessment/Plan:  CAP (community acquired pneumonia):  - continues to be febrile, clinically he is improving. - repeat CXR today given persistent fevers - continue Antibiotics - influenza negative. I wonder whether this is viral.  - strep pneumo negative, legionella pending  Sepsis  - present on admission with SIRS (systemic inflammatory response syndrome) and a source:  - Secondary to CAP, improving  Conjunctivitis  - supportive treatment, compresses   Disposition: Remain inpatient  DVT Prophylaxis: Lovenox  Code Status: Full code  Diet: Regular Family Communication Mother at bedside  Antibiotics:  Ceftriaxone 12/11 >>  Azithromycin 12/11 >>  Procedures:  None  CONSULTS: None  PHYSICAL EXAM: Vital signs in last 24 hours: Filed Vitals:   12/04/14 0338 12/04/14 0449 12/04/14 0606 12/04/14 0626  BP: 134/94     Pulse: 86     Temp: 98.3 F (36.8 C) 100.2 F (37.9 C) 101.7 F (38.7 C)   TempSrc: Oral     Resp: 18     Height:      Weight:      SpO2: 100%   99%    Weight change:  Filed Weights   12/01/14 2100  Weight: 105.779 kg (233 lb 3.2 oz)   Body mass index is 32.54 kg/(m^2).   Gen Exam: Awake and alert with clear speech.   Neck: Supple, No JVD.   Chest: Right basal rales.   CV: RRR, no MRG Abdomen: soft, BS +, non tender Extremities: no edema Skin: No Rash.    Intake/Output  from previous day:  Intake/Output Summary (Last 24 hours) at 12/04/14 1101 Last data filed at 12/04/14 0900  Gross per 24 hour  Intake 3851.67 ml  Output      0 ml  Net 3851.67 ml   LAB RESULTS: CBC  Recent Labs Lab 12/01/14 1354 12/02/14 0403 12/03/14 0440  WBC 5.3 6.3 6.9  HGB 16.0 14.0 13.1  HCT 46.4 41.0 39.1  PLT 251 240 246  MCV 82.6 84.4 85.4  MCH 28.5 28.8 28.6  MCHC 34.5 34.1 33.5  RDW 13.1 13.4 13.5  LYMPHSABS 0.8  --   --   MONOABS 0.5  --   --   EOSABS 0.0  --   --   BASOSABS 0.0  --   --    Chemistries   Recent Labs Lab 12/01/14 1354 12/02/14 0403 12/03/14 0440  NA 135* 136* 137  K 3.6* 3.6* 4.2  CL 96 99 102  CO2 20 22 23   GLUCOSE 98 99 99  BUN 11 10 9   CREATININE 1.10 1.07 1.20  CALCIUM 9.5 8.6 8.7   GFR Estimated Creatinine Clearance: 113.4 mL/min (by C-G formula based on Cr of 1.2).  MICROBIOLOGY: Recent Results (from the past 240 hour(s))  Culture, blood (routine x 2) Call MD if unable to obtain prior to antibiotics being given     Status: None (Preliminary result)   Collection  Time: 12/01/14  1:50 PM  Result Value Ref Range Status   Specimen Description BLOOD RIGHT WRIST  Final   Special Requests BOTTLES DRAWN AEROBIC AND ANAEROBIC 5CC  Final   Culture  Setup Time   Final    12/01/2014 22:41 [facility]formed at [facility]    Culture   Final           BLOOD CULTURE RECEIVED NO GROWTH TO DATE CULTURE WILL BE HELD FOR 5 DAYS BEFORE ISSUING A FINAL NEGATIVE REPORT [facility]formed at [facility]    Report Status PENDING  Incomplete  Culture, blood (routine x 2) Call MD if unable to obtain prior to antibiotics being given     Status: None (Preliminary result)   Collection Time: 12/01/14  6:20 PM  Result Value Ref Range Status   Specimen Description BLOOD LEFT ARM  Final   Special Requests BOTTLES DRAWN AEROBIC AND ANAEROBIC 10CC  Final   Culture  Setup Time   Final    12/01/2014 22:42 [facility]formed at [facility]     Culture   Final           BLOOD CULTURE RECEIVED NO GROWTH TO DATE CULTURE WILL BE HELD FOR 5 DAYS BEFORE ISSUING A FINAL NEGATIVE REPORT [facility]formed at [facility]    Report Status PENDING  Incomplete  Urine culture     Status: None   Collection Time: 12/01/14  8:11 PM  Result Value Ref Range Status   Specimen Description URINE, CLEAN CATCH  Final   Special Requests Normal  Final   Culture  Setup Time   Final    12/02/2014 01:33 [facility]formed at [facility]    Colony Count   Final    25,000 COLONIES/ML Performed at [facility]    Culture   Final    Multiple bacterial morphotypes present, none predominant. Suggest appropriate recollection if clinically indicated. Performed at [facility]    Report Status 12/03/2014 FINAL  Final  Rapid strep screen     Status: None   Collection Time: 12/02/14  1:11 AM  Result Value Ref Range Status   Streptococcus, Group A Screen (Direct) NEGATIVE NEGATIVE Final    Comment: (NOTE) A Rapid Antigen test may result negative if the antigen level in the sample is below the detection level of this test. The FDA has not cleared this test as a stand-alone test therefore the rapid antigen negative result has reflexed to a Group A Strep culture.   Culture, Group A Strep     Status: None (Preliminary result)   Collection Time: 12/02/14  1:11 AM  Result Value Ref Range Status   Specimen Description THROAT  Final   Special Requests NONE  Final   Culture   Final    NO SUSPICIOUS COLONIES, CONTINUING TO HOLD Performed at [facility]    Report Status PENDING  Incomplete  Culture, sputum-assessment     Status: None   Collection Time: 12/02/14  2:11 AM  Result Value Ref Range Status   Specimen Description SPUTUM  Final   Special Requests Normal  Final   Sputum evaluation   Final    THIS SPECIMEN IS ACCEPTABLE. RESPIRATORY CULTURE REPORT TO FOLLOW.   Report Status 12/02/2014 FINAL  Final  Culture, respiratory  (NON-Expectorated)     Status: None (Preliminary result)   Collection Time: 12/02/14  2:11 AM  Result Value Ref Range Status   Specimen Description SPUTUM  Final   Special  Requests NONE  Final   Gram Stain PENDING  Incomplete   Culture   Final    NORMAL OROPHARYNGEAL FLORA Performed at [facility]    Report Status PENDING  Incomplete    RADIOLOGY STUDIES/RESULTS: Dg Chest 2 View (if Patient Has Fever And/or Copd) 12/01/2014  1. Medial right lower lobe airspace disease is concerning for pneumonia. 2. Low lung volumes with mild left basilar airspace disease, likely atelectasis.   Ct Angio Chest Pe W/cm &/or Wo Cm 12/01/2014 No evidence of acute pulmonary thromboembolism  There is extensive consolidation in the right lower lobe. It is has a somewhat unusual appearance with less than expected air bronchograms and low density. It is also associated with mild right hilar and subcarinal adenopathy. Malignancy would be unusual in this age group. Eight should still be considered. Opportunistic infection such as mycobacterium species and fungal disorder should be suspected. Follow-up imaging by radiographs until complete resolution is recommended. If abnormalities fail to resolve, subsequent PET-CT may be helpful.  Mild patchy density at the base of the lingula.   Medications: Scheduled Meds: . azithromycin  500 mg Intravenous Q24H  . benzonatate  200 mg Oral TID  . cefTRIAXone (ROCEPHIN)  IV  1 g Intravenous Q24H  . chlorpheniramine-HYDROcodone  5 mL Oral Q12H  . enoxaparin (LOVENOX) injection  40 mg Subcutaneous Q24H   Continuous Infusions: . sodium chloride 125 mL/hr at 12/04/14 0608   PRN Meds:.acetaminophen, guaiFENesin-dextromethorphan, ipratropium, levalbuterol, menthol-cetylpyridinium, naphazoline, ondansetron (ZOFRAN) IV   Time spent 25 minutes    Pamella PertGHERGHE, Zoeann Mol, MD  [facility] Pager:[phone]  If 7PM-7AM, please contact  night-coverage [email or web address] Password [email or web address] 12/04/2014, 11:01 AM   LOS: 3 days

## 2014-12-05 LAB — LEGIONELLA ANTIGEN, URINE

## 2014-12-05 LAB — CULTURE, RESPIRATORY: CULTURE: NORMAL

## 2014-12-05 LAB — CULTURE, RESPIRATORY W GRAM STAIN

## 2014-12-05 LAB — PROCALCITONIN: Procalcitonin: 0.12 ng/mL

## 2014-12-05 MED ORDER — OFLOXACIN 0.3 % OP SOLN
1.0000 [drp] | Freq: Four times a day (QID) | OPHTHALMIC | Status: DC
  Administered 2014-12-05 – 2014-12-06 (×4): 1 [drp] via OPHTHALMIC
  Filled 2014-12-05: qty 5

## 2014-12-05 MED ORDER — LEVALBUTEROL HCL 0.63 MG/3ML IN NEBU: 0.6300 mg | INHALATION_SOLUTION | Freq: Four times a day (QID) | RESPIRATORY_TRACT | Status: DC | PRN

## 2014-12-05 NOTE — Care Management Note (Signed)
    Page 1 of 1   12/05/2014     10:44:08 AM CARE MANAGEMENT NOTE 12/05/2014  Patient:  Brian Friedman,Brian Friedman   Account Number:  [id number]  Date Initiated:  12/05/2014  Documentation initiated by:  Lanier ClamMAHABIR,Nuchem Grattan  Subjective/Objective Assessment:   28 y/o m admitted w/pna.     Action/Plan:   From home.   Anticipated DC Date:  12/07/2014   Anticipated DC Plan:  HOME/SELF CARE      DC Planning Services  CM consult      Choice offered to / List presented to:             Status of service:  In process, will continue to follow Medicare Important Message given?   (If response is "NO", the following Medicare IM given date fields will be blank) Date Medicare IM given:   Medicare IM given by:   Date Additional Medicare IM given:   Additional Medicare IM given by:    Discharge Disposition:    Per UR Regulation:  Reviewed for med. necessity/level of care/duration of stay  If discussed at Long Length of Stay Meetings, dates discussed:    Comments:  12/05/14 Lanier ClamKathy Detavious Rinn RN BSN NCM [phone] [id number] Monitor for progress.No anticipated d/c needs.

## 2014-12-05 NOTE — Progress Notes (Signed)
[facility] PROGRESS NOTE  Brian MurdochChristopher J Friedman RUE:[id number] DOB: 11/18/1986 DOA: 12/01/2014 PCP: No primary care provider on file.   28 y/o male with no medical hx presenting with CAP. Hospital course prolonged fur to ongoing fever.  Assessment/Plan: Community acquired pneumonia  continue empiric abx cx negative. Urine legionella pending Still febrile but Tmax improving daily. Follow up CXR on 12/14 shows worsening consolidation on rt. However, clinically improved . If still spiking temp will obtain CT of the chest to r/o effusion or empyema.  Supportive care with tylenol and antitussives.  Sepsis  on admission, now resolved  Conjunctivitis, right  supportive care with cold  Compress, artificial tear. Will order ofloxacin eye drops  Code Status: full  Family Communication:none  Disposition Plan: home tomorrow if improved   Consultants:  none  Procedures:  none  Antibiotics:  Rocephin and azithro since 12/11  HPI/Subjective: Seen and examined. Reports breathing to be better , still has productive cough. T max of 100.53F over 24 hrs  Objective: Filed Vitals:   12/05/14 0758  BP:   Pulse:   Temp: 98 F (36.7 C)  Resp:     Intake/Output Summary (Last 24 hours) at 12/05/14 1119 Last data filed at 12/05/14 0800  Gross per 24 hour  Intake 2468.33 ml  Output      1 ml  Net 2467.33 ml   Filed Weights   12/01/14 2100  Weight: 105.779 kg (233 lb 3.2 oz)    Exam:   General:  NAD   GEN: conjunctivitis over rt eye, mild erythema of left eye  Cardiovascular: NS1&S2, no murmurs  Respiratory: coarse breath sounds over rt lung base, no rhonchi or wheeze  Abdomen: soft, NT, ND,   Musculoskeletal: warm, no edema  Data Reviewed: Basic Metabolic Panel:  Recent Labs Lab 12/01/14 1354 12/02/14 0403 12/03/14 0440  NA 135* 136* 137  K 3.6* 3.6* 4.2  CL 96 99 102  CO2 20 22 23   GLUCOSE 98 99 99  BUN 11 10 9   CREATININE 1.10 1.07 1.20  CALCIUM  9.5 8.6 8.7   Liver Function Tests: No results for input(s): AST, ALT, ALKPHOS, BILITOT, PROT, ALBUMIN in the last 168 hours. No results for input(s): LIPASE, AMYLASE in the last 168 hours. No results for input(s): AMMONIA in the last 168 hours. CBC:  Recent Labs Lab 12/01/14 1354 12/02/14 0403 12/03/14 0440  WBC 5.3 6.3 6.9  NEUTROABS 3.9  --   --   HGB 16.0 14.0 13.1  HCT 46.4 41.0 39.1  MCV 82.6 84.4 85.4  PLT 251 240 246   Cardiac Enzymes: No results for input(s): CKTOTAL, CKMB, CKMBINDEX, TROPONINI in the last 168 hours. BNP (last 3 results)  Recent Labs  12/01/14 1354  PROBNP 14.6   CBG: No results for input(s): GLUCAP in the last 168 hours.  Recent Results (from the past 240 hour(s))  Culture, blood (routine x 2) Call MD if unable to obtain prior to antibiotics being given     Status: None (Preliminary result)   Collection Time: 12/01/14  1:50 PM  Result Value Ref Range Status   Specimen Description BLOOD RIGHT WRIST  Final   Special Requests BOTTLES DRAWN AEROBIC AND ANAEROBIC 5CC  Final   Culture  Setup Time   Final    12/01/2014 22:41 Performed at [facility]    Culture   Final           BLOOD CULTURE RECEIVED NO GROWTH TO DATE CULTURE WILL BE  HELD FOR 5 DAYS BEFORE ISSUING A FINAL NEGATIVE REPORT Performed at [facility]    Report Status PENDING  Incomplete  Culture, blood (routine x 2) Call MD if unable to obtain prior to antibiotics being given     Status: None (Preliminary result)   Collection Time: 12/01/14  6:20 PM  Result Value Ref Range Status   Specimen Description BLOOD LEFT ARM  Final   Special Requests BOTTLES DRAWN AEROBIC AND ANAEROBIC 10CC  Final   Culture  Setup Time   Final    12/01/2014 22:42 Performed at [facility]    Culture   Final           BLOOD CULTURE RECEIVED NO GROWTH TO DATE CULTURE WILL BE HELD FOR 5 DAYS BEFORE ISSUING A FINAL NEGATIVE REPORT Performed at [facility]    Report  Status PENDING  Incomplete  Urine culture     Status: None   Collection Time: 12/01/14  8:11 PM  Result Value Ref Range Status   Specimen Description URINE, CLEAN CATCH  Final   Special Requests Normal  Final   Culture  Setup Time   Final    12/02/2014 01:33 Performed at [facility]    Colony Count   Final    25,000 COLONIES/ML Performed at [facility]    Culture   Final    Multiple bacterial morphotypes present, none predominant. Suggest appropriate recollection if clinically indicated. Performed at [facility]    Report Status 12/03/2014 FINAL  Final  Rapid strep screen     Status: None   Collection Time: 12/02/14  1:11 AM  Result Value Ref Range Status   Streptococcus, Group A Screen (Direct) NEGATIVE NEGATIVE Final    Comment: (NOTE) A Rapid Antigen test may result negative if the antigen level in the sample is below the detection level of this test. The FDA has not cleared this test as a stand-alone test therefore the rapid antigen negative result has reflexed to a Group A Strep culture.   Culture, Group A Strep     Status: None   Collection Time: 12/02/14  1:11 AM  Result Value Ref Range Status   Specimen Description THROAT  Final   Special Requests NONE  Final   Culture   Final    No Beta Hemolytic Streptococci Isolated Performed at [facility]    Report Status 12/04/2014 FINAL  Final  Culture, sputum-assessment     Status: None   Collection Time: 12/02/14  2:11 AM  Result Value Ref Range Status   Specimen Description SPUTUM  Final   Special Requests Normal  Final   Sputum evaluation   Final    THIS SPECIMEN IS ACCEPTABLE. RESPIRATORY CULTURE REPORT TO FOLLOW.   Report Status 12/02/2014 FINAL  Final  Culture, respiratory (NON-Expectorated)     Status: None (Preliminary result)   Collection Time: 12/02/14  2:11 AM  Result Value Ref Range Status   Specimen Description SPUTUM  Final   Special Requests NONE  Final   Gram Stain  PENDING  Incomplete   Culture   Final    NORMAL OROPHARYNGEAL FLORA Performed at [facility]    Report Status PENDING  Incomplete     Studies: Dg Chest 2 View  12/04/2014   CLINICAL DATA:  Cough and shortness of Breath; chest pain extending into the right upper anterior chest. Recent documentation of right lower lobe pneumonia.  EXAM: CHEST  2 VIEW  COMPARISON:  Chest radiograph December 01, 2014 and chest CT December 01, 2014  FINDINGS: Right lower lobe pneumonia is again noted, increased compared to recent studies. There is a small area of consolidation in the lateral left base as well, stable. Lungs elsewhere clear. Heart size and pulmonary vascularity are normal. No adenopathy. No bone lesions. There are surgical clips in the right axillary region.  IMPRESSION: Increased consolidation right lower lobe. Stable focus of infiltrate lateral left base. Lungs elsewhere clear. No change in cardiac silhouette.   Electronically Signed   By: Bretta Bang M.D.   On: 12/04/2014 10:12    Scheduled Meds: . azithromycin  500 mg Intravenous Q24H  . benzonatate  200 mg Oral TID  . cefTRIAXone (ROCEPHIN)  IV  1 g Intravenous Q24H  . chlorpheniramine-HYDROcodone  5 mL Oral Q12H  . enoxaparin (LOVENOX) injection  40 mg Subcutaneous Q24H   Continuous Infusions: . sodium chloride 125 mL/hr at 12/05/14 0100      Time spent: 25 minutes    Kemia Wendel  [facility] Pager [phone] If 7PM-7AM, please contact night-coverage at [email or web address], password [facility] 12/05/2014, 11:19 AM  LOS: 4 days

## 2014-12-06 MED ORDER — HYDROCOD POLST-CHLORPHEN POLST 10-8 MG/5ML PO LQCR: 5.0000 mL | Freq: Two times a day (BID) | ORAL | Status: AC

## 2014-12-06 MED ORDER — OFLOXACIN 0.3 % OP SOLN: 1.0000 [drp] | Freq: Four times a day (QID) | OPHTHALMIC | Status: AC

## 2014-12-06 MED ORDER — NAPHAZOLINE HCL 0.1 % OP SOLN: 1.0000 [drp] | Freq: Four times a day (QID) | OPHTHALMIC | Status: AC | PRN

## 2014-12-06 MED ORDER — BENZONATATE 200 MG PO CAPS: 200.0000 mg | ORAL_CAPSULE | Freq: Three times a day (TID) | ORAL | Status: AC

## 2014-12-06 MED ORDER — LEVOFLOXACIN 750 MG PO TABS: 750.0000 mg | ORAL_TABLET | Freq: Every day | ORAL | Status: AC

## 2014-12-06 NOTE — Discharge Instructions (Addendum)
Allergic Conjunctivitis  The conjunctiva is a thin membrane that covers the visible white part of the eyeball and the underside of the eyelids. This membrane protects and lubricates the eye. The membrane has small blood vessels running through it that can normally be seen. When the conjunctiva becomes inflamed, the condition is called conjunctivitis. In response to the inflammation, the conjunctival blood vessels become swollen. The swelling results in redness in the normally white part of the eye.  The blood vessels of this membrane also react when a person has allergies and is then called allergic conjunctivitis. This condition usually lasts for as long as the allergy persists. Allergic conjunctivitis cannot be passed to another person (non-contagious). The likelihood of bacterial infection is great and the cause is not likely due to allergies if the inflamed eye has:  · A sticky discharge.  · Discharge or sticking together of the lids in the morning.  · Scaling or flaking of the eyelids where the eyelashes come out.  · Red swollen eyelids.  CAUSES   · Viruses.  · Irritants such as foreign bodies.  · Chemicals.  · General allergic reactions.  · Inflammation or serious diseases in the inside or the outside of the eye or the orbit (the boney cavity in which the eye sits) can cause a "red eye."  SYMPTOMS   · Eye redness.  · Tearing.  · Itchy eyes.  · Burning feeling in the eyes.  · Clear drainage from the eye.  · Allergic reaction due to pollens or ragweed sensitivity. Seasonal allergic conjunctivitis is frequent in the spring when pollens are in the air and in the fall.  DIAGNOSIS   This condition, in its many forms, is usually diagnosed based on the history and an ophthalmological exam. It usually involves both eyes. If your eyes react at the same time every year, allergies may be the cause. While most "red eyes" are due to allergy or an infection, the role of an eye (ophthalmological) exam is important. The exam  can rule out serious diseases of the eye or orbit.  TREATMENT   · Non-antibiotic eye drops, ointments, or medications by mouth may be prescribed if the ophthalmologist is sure the conjunctivitis is due to allergies alone.  · Over-the-counter drops and ointments for allergic symptoms should be used only after other causes of conjunctivitis have been ruled out, or as your caregiver suggests.  Medications by mouth are often prescribed if other allergy-related symptoms are present. If the ophthalmologist is sure that the conjunctivitis is due to allergies alone, treatment is normally limited to drops or ointments to reduce itching and burning.  HOME CARE INSTRUCTIONS   · Wash hands before and after applying drops or ointments, or touching the inflamed eye(s) or eyelids.  · Do not let the eye dropper tip or ointment tube touch the eyelid when putting medicine in your eye.  · Stop using your soft contact lenses and throw them away. Use a new pair of lenses when recovery is complete. You should run through sterilizing cycles at least three times before use after complete recovery if the old soft contact lenses are to be used. Hard contact lenses should be stopped. They need to be thoroughly sterilized before use after recovery.  · Itching and burning eyes due to allergies is often relieved by using a cool cloth applied to closed eye(s).  SEEK MEDICAL CARE IF:   · Your problems do not go away after two or three days of treatment.  ·   Your lids are sticky (especially in the morning when you wake up) or stick together.  · Discharge develops. Antibiotics may be needed either as drops, ointment, or by mouth.  · You have extreme light sensitivity.  · An oral temperature above 102° F (38.9° C) develops.  · Pain in or around the eye or any other visual symptom develops.  MAKE SURE YOU:   · Understand these instructions.  · Will watch your condition.  · Will get help right away if you are not doing well or get worse.  Document  Released: 02/28/2003 Document Revised: 03/01/2012 Document Reviewed: 01/24/2008  [employer]® Patient Information ©2015 [employer]. This information is not intended to replace advice given to you by your health care provider. Make sure you discuss any questions you have with your health care provider.

## 2014-12-06 NOTE — Discharge Summary (Signed)
Physician Discharge Summary  Brian Friedman HYQ:[id number] DOB: 03-31-1986 DOA: 12/01/2014  PCP: No primary care provider on file.  Admit date: 12/01/2014 Discharge date: 12/06/2014  Time spent: >30 minutes  Recommendations for Outpatient Follow-up:  1. D/c home. Completes abx on 12/17. 2.  patient will call to schedule a new PCP appt . Deciding to see either Dr Alwyn Ren or Dr Posey Rea 3. Patient will need follow up CXR in 4-6 weeks to check for resolution of pneumonia  Discharge Diagnoses:  Principal Problem:   SIRS (systemic inflammatory response syndrome)  Active Problems:   CAP (community acquired pneumonia)   Hypokalemia   Tachycardia   Discharge Condition: FAIR  Diet recommendation: regular  Filed Weights   12/01/14 2100  Weight: 105.779 kg (233 lb 3.2 oz)    History of present illness:  Please refer to admission H&P for details, but in brief,  28 year old male with no  past medical history was sent from the [facility] urgent care for fever with productive cough for past 4-5 days.. his symptoms started 4-5 days ago when he noticed fatigue, generalized weakness. Subsequently he started developing subjective fevers and chills, productive cough yellowish to greenish sputum. He reported has poor appetite. He also complained of shortness of breath and right-sided pleuritic chest pain. Patient denied any recent sick contacts, recent travels, any rashes or tick bites. Patient has 57-month-old baby and reports that he received all the vaccinations prior to the baby. He is not sure but thinks that he did receive a flu shot as well.  Patient went to [facility] urgent care and was diagnosed with right lung pneumonia and was sent to the ER. Patient was noticed to be tachycardiac with heart rate in 130s EKGs was obtained which showed sinus tachycardia, CT chest was done, ruled out for acute PE however showed extensive consolidation in the right lower lung.   Hospital Course:   SIRS due to Community acquired pneumonia placed on empiric abx with rocephin and axithromycin cx negative. Urine strep ag and  legionella pending. Hospital stay prolonged due to persistent fever. Follow up CXR on 12/14 shows worsening right sided  consolidation .  However, patient is afebrile for past 24 hrs and breathing improved. Still has productive cough but improving. . Will discharge on oral Levaquin to complete a 7 day course of abx. Patient will need a follow up CXR in 4-6 weeks to check for resolution of pneumonia.  Supportive care with tylenol and antitussives.  Sepsis  on admission, now resolved  Conjunctivitis, bilateral supportive care with cold Compress, artificial tear. prescribe ofloxacin eye drops  Code Status: full   Family Communication:  Mother at bedside  Disposition Plan: home    Consultants:  none  Procedures:  none  Antibiotics:  Rocephin and azithro since 12/11  Discharge Exam: Filed Vitals:   12/06/14 0439  BP: 106/59  Pulse: 62  Temp: 97.6 F (36.4 C)  Resp: 18     General: NAD  GEN: Bilateral conjunctivitis ( R>L)  Cardiovascular: NS1&S2, no murmurs  Respiratory: coarse breath sounds over rt lung base, ( improved) no rhonchi or wheeze  Abdomen: soft, NT, ND,   Musculoskeletal: warm, no edema  Discharge Instructions You were cared for by a hospitalist during your hospital stay. If you have any questions about your discharge medications or the care you received while you were in the hospital after you are discharged, you can call the unit and asked to speak with the hospitalist on call if  the hospitalist that took care of you is not available. Once you are discharged, your primary care physician will handle any further medical issues. Please note that NO REFILLS for any discharge medications will be authorized once you are discharged, as it is imperative that you return to your primary care physician (or establish a relationship  with a primary care physician if you do not have one) for your aftercare needs so that they can reassess your need for medications and monitor your lab values.   Current Discharge Medication List    START taking these medications   Details  benzonatate (TESSALON) 200 MG capsule Take 1 capsule (200 mg total) by mouth 3 (three) times daily. Qty: 20 capsule, Refills: 0    chlorpheniramine-HYDROcodone (TUSSIONEX) 10-8 MG/5ML LQCR Take 5 mLs by mouth every 12 (twelve) hours. Qty: 115 mL, Refills: 0    levofloxacin (LEVAQUIN) 750 MG tablet Take 1 tablet (750 mg total) by mouth daily. Qty: 2 tablet, Refills: 0    naphazoline (NAPHCON) 0.1 % ophthalmic solution Place 1 drop into both eyes 4 (four) times daily as needed for irritation. Qty: 15 mL, Refills: 0    ofloxacin (OCUFLOX) 0.3 % ophthalmic solution Place 1 drop into both eyes 4 (four) times daily. Qty: 5 mL, Refills: 0      CONTINUE these medications which have NOT CHANGED   Details  acetaminophen (TYLENOL) 500 MG tablet Take 500 mg by mouth every 6 (six) hours as needed for fever.    aspirin-acetaminophen-caffeine (EXCEDRIN MIGRAINE) 250-250-65 MG per tablet Take 1 tablet by mouth every 6 (six) hours as needed for headache.    DM-Doxylamine-Acetaminophen (VICKS NYQUIL COLD & FLU) 15-6.25-325 MG CAPS Take 2 capsules by mouth every 6 (six) hours.       Allergies  Allergen Reactions  . Other     Muscle relaxers: Make me angry  . Penicillins       The results of significant diagnostics from this hospitalization (including imaging, microbiology, ancillary and laboratory) are listed below for reference.    Significant Diagnostic Studies: Dg Chest 2 View  12/04/2014   CLINICAL DATA:  Cough and shortness of Breath; chest pain extending into the right upper anterior chest. Recent documentation of right lower lobe pneumonia.  EXAM: CHEST  2 VIEW  COMPARISON:  Chest radiograph December 01, 2014 and chest CT December 01, 2014   FINDINGS: Right lower lobe pneumonia is again noted, increased compared to recent studies. There is a small area of consolidation in the lateral left base as well, stable. Lungs elsewhere clear. Heart size and pulmonary vascularity are normal. No adenopathy. No bone lesions. There are surgical clips in the right axillary region.  IMPRESSION: Increased consolidation right lower lobe. Stable focus of infiltrate lateral left base. Lungs elsewhere clear. No change in cardiac silhouette.   Electronically Signed   By: Bretta BangWilliam  Woodruff M.D.   On: 12/04/2014 10:12   Dg Chest 2 View (if Patient Has Fever And/or Copd)  12/01/2014   CLINICAL DATA:  Progressive cough, shortness breath, and chest pain.  EXAM: CHEST  2 VIEW  COMPARISON:  None.  FINDINGS: The heart size is normal. Lung volumes are low. Medial right lower lobe airspace disease is concerning for pneumonia. Left basilar airspace disease is less prominent, likely atelectasis. The upper lung fields are clear. Mild pulmonary vascular congestion is evident. Surgical clips are noted over the right axilla.  IMPRESSION: 1. Medial right lower lobe airspace disease is concerning for pneumonia. 2. Low  lung volumes with mild left basilar airspace disease, likely atelectasis.   Electronically Signed   By: Gennette Pac M.D.   On: 12/01/2014 13:51   Ct Angio Chest Pe W/cm &/or Wo Cm  12/01/2014   CLINICAL DATA:  Fever.  Short of breath.  EXAM: CT ANGIOGRAPHY CHEST WITH CONTRAST  TECHNIQUE: Multidetector CT imaging of the chest was performed using the standard protocol during bolus administration of intravenous contrast. Multiplanar CT image reconstructions and MIPs were obtained to evaluate the vascular anatomy.  CONTRAST:  OMNIPAQUE IOHEXOL 350 MG/ML SOLN  COMPARISON:  None.  FINDINGS: There are no filling defects in the pulmonary arterial tree to suggest acute pulmonary thromboembolism  No pericardial effusion.  No pneumothorax or pleural effusion  Minimal  patchy density at the base of the lingula.  There is extensive consolidation in the right lower lobe. Some air bronchograms are present. There are other areas were there are no air bronchograms. Overall density is somewhat less than expected for consolidation. Right infrahilar adenopathy is suspected on image 46. 10 mm subcarinal lymph node.  No destructive bone lesion.  Review of the MIP images confirms the above findings.  IMPRESSION: No evidence of acute pulmonary thromboembolism  There is extensive consolidation in the right lower lobe. It is has a somewhat unusual appearance with less than expected air bronchograms and low density. It is also associated with mild right hilar and subcarinal adenopathy. Malignancy would be unusual in this age group. Eight should still be considered. Opportunistic infection such as mycobacterium species and fungal disorder should be suspected. Follow-up imaging by radiographs until complete resolution is recommended. If abnormalities fail to resolve, subsequent PET-CT may be helpful.  Mild patchy density at the base of the lingula.   Electronically Signed   By: Maryclare Bean M.D.   On: 12/01/2014 15:47    Microbiology: Recent Results (from the past 240 hour(s))  Culture, blood (routine x 2) Call MD if unable to obtain prior to antibiotics being given     Status: None (Preliminary result)   Collection Time: 12/01/14  1:50 PM  Result Value Ref Range Status   Specimen Description BLOOD RIGHT WRIST  Final   Special Requests BOTTLES DRAWN AEROBIC AND ANAEROBIC 5CC  Final   Culture  Setup Time   Final    12/01/2014 22:41 Performed at [facility]    Culture   Final           BLOOD CULTURE RECEIVED NO GROWTH TO DATE CULTURE WILL BE HELD FOR 5 DAYS BEFORE ISSUING A FINAL NEGATIVE REPORT Performed at [facility]    Report Status PENDING  Incomplete  Culture, blood (routine x 2) Call MD if unable to obtain prior to antibiotics being given     Status: None  (Preliminary result)   Collection Time: 12/01/14  6:20 PM  Result Value Ref Range Status   Specimen Description BLOOD LEFT ARM  Final   Special Requests BOTTLES DRAWN AEROBIC AND ANAEROBIC 10CC  Final   Culture  Setup Time   Final    12/01/2014 22:42 Performed at [facility]    Culture   Final           BLOOD CULTURE RECEIVED NO GROWTH TO DATE CULTURE WILL BE HELD FOR 5 DAYS BEFORE ISSUING A FINAL NEGATIVE REPORT Performed at [facility]    Report Status PENDING  Incomplete  Urine culture     Status: None   Collection Time: 12/01/14  8:11 PM  Result Value Ref Range Status   Specimen Description URINE, CLEAN CATCH  Final   Special Requests Normal  Final   Culture  Setup Time   Final    12/02/2014 01:33 Performed at [facility]    Colony Count   Final    25,000 COLONIES/ML Performed at [facility]    Culture   Final    Multiple bacterial morphotypes present, none predominant. Suggest appropriate recollection if clinically indicated. Performed at [facility]    Report Status 12/03/2014 FINAL  Final  Rapid strep screen     Status: None   Collection Time: 12/02/14  1:11 AM  Result Value Ref Range Status   Streptococcus, Group A Screen (Direct) NEGATIVE NEGATIVE Final    Comment: (NOTE) A Rapid Antigen test may result negative if the antigen level in the sample is below the detection level of this test. The FDA has not cleared this test as a stand-alone test therefore the rapid antigen negative result has reflexed to a Group A Strep culture.   Culture, Group A Strep     Status: None   Collection Time: 12/02/14  1:11 AM  Result Value Ref Range Status   Specimen Description THROAT  Final   Special Requests NONE  Final   Culture   Final    No Beta Hemolytic Streptococci Isolated Performed at [facility]    Report Status 12/04/2014 FINAL  Final  Culture, sputum-assessment     Status: None   Collection Time: 12/02/14   2:11 AM  Result Value Ref Range Status   Specimen Description SPUTUM  Final   Special Requests Normal  Final   Sputum evaluation   Final    THIS SPECIMEN IS ACCEPTABLE. RESPIRATORY CULTURE REPORT TO FOLLOW.   Report Status 12/02/2014 FINAL  Final  Culture, respiratory (NON-Expectorated)     Status: None   Collection Time: 12/02/14  2:11 AM  Result Value Ref Range Status   Specimen Description SPUTUM  Final   Special Requests NONE  Final   Gram Stain   Final    FEW WBC PRESENT,BOTH PMN AND MONONUCLEAR RARE SQUAMOUS EPITHELIAL CELLS PRESENT NO ORGANISMS SEEN Performed at [facility]    Culture   Final    NORMAL OROPHARYNGEAL FLORA Performed at [facility]    Report Status 12/05/2014 FINAL  Final     Labs: Basic Metabolic Panel:  Recent Labs Lab 12/01/14 1354 12/02/14 0403 12/03/14 0440  NA 135* 136* 137  K 3.6* 3.6* 4.2  CL 96 99 102  CO2 20 22 23   GLUCOSE 98 99 99  BUN 11 10 9   CREATININE 1.10 1.07 1.20  CALCIUM 9.5 8.6 8.7   Liver Function Tests: No results for input(s): AST, ALT, ALKPHOS, BILITOT, PROT, ALBUMIN in the last 168 hours. No results for input(s): LIPASE, AMYLASE in the last 168 hours. No results for input(s): AMMONIA in the last 168 hours. CBC:  Recent Labs Lab 12/01/14 1354 12/02/14 0403 12/03/14 0440  WBC 5.3 6.3 6.9  NEUTROABS 3.9  --   --   HGB 16.0 14.0 13.1  HCT 46.4 41.0 39.1  MCV 82.6 84.4 85.4  PLT 251 240 246   Cardiac Enzymes: No results for input(s): CKTOTAL, CKMB, CKMBINDEX, TROPONINI in the last 168 hours. BNP: BNP (last 3 results)  Recent Labs  12/01/14 1354  PROBNP 14.6   CBG: No results for input(s): GLUCAP in the last 168 hours.  SignedEddie North  [facility] 12/06/2014, 10:23 AM

## 2014-12-07 LAB — CULTURE, BLOOD (ROUTINE X 2)
CULTURE: NO GROWTH
CULTURE: NO GROWTH

## 2015-06-19 IMAGING — CT CT ANGIO CHEST
1 of 2 series · 19 of 32 positions shown · IV contrast (OMNIPAQUE 350)
Comparison: None.

CLINICAL DATA: Fever.  Short of breath.

EXAM:
CT ANGIOGRAPHY CHEST WITH CONTRAST
TECHNIQUE: Multidetector CT imaging of the chest was performed using the
standard protocol during bolus administration of intravenous
contrast. Multiplanar CT image reconstructions and MIPs were
obtained to evaluate the vascular anatomy.
CONTRAST:  100mL OMNIPAQUE IOHEXOL 350 MG/ML SOLN

[Series 6: thins for pacs · axial · 0.64mm/px · z∈[+1486,+1700]mm · 19 of 237 slices shown]
[im 12/237  lung]
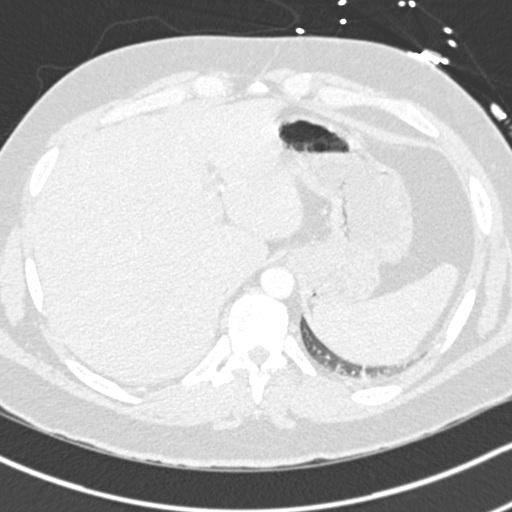
[im 24/237  mediastinal]
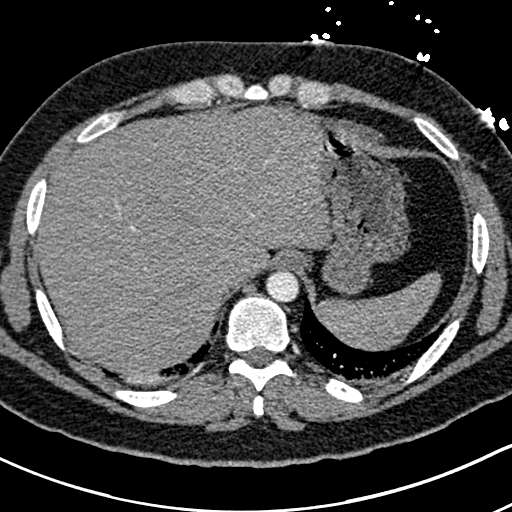
[im 36/237  lung]
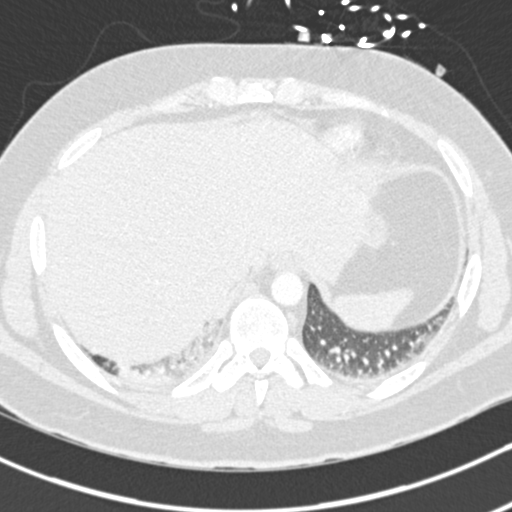
[im 60/237  mediastinal]
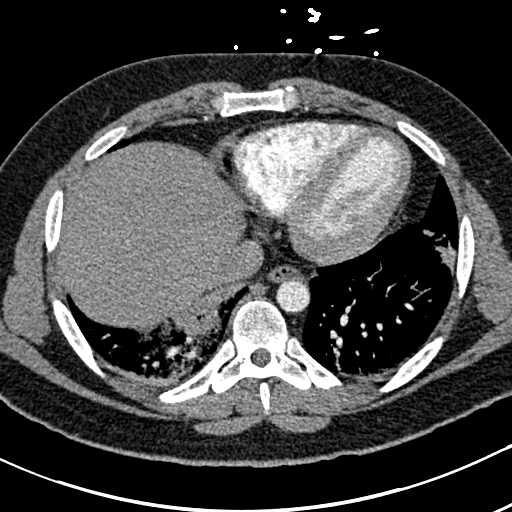
[im 71/237  lung]
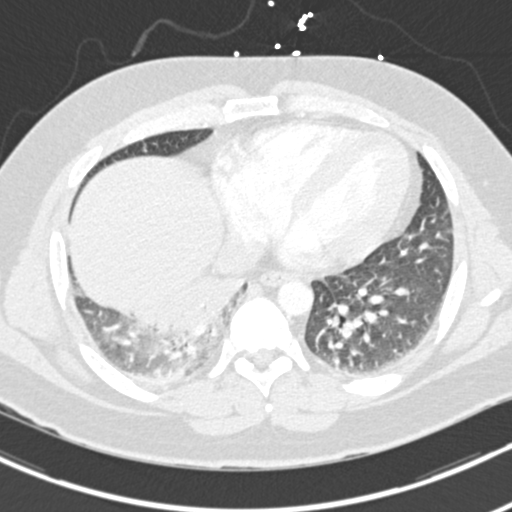
[im 79/237  mediastinal]
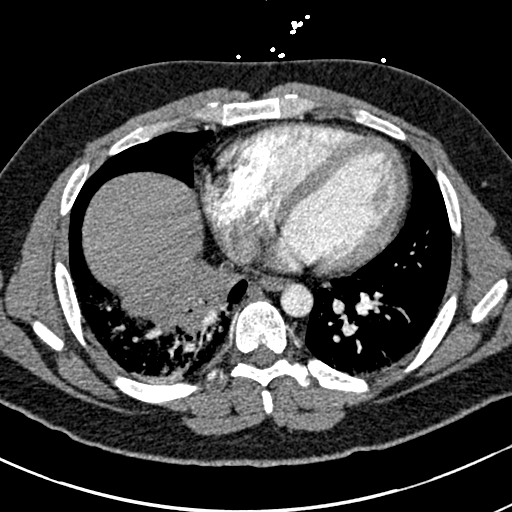
[im 83/237  lung]
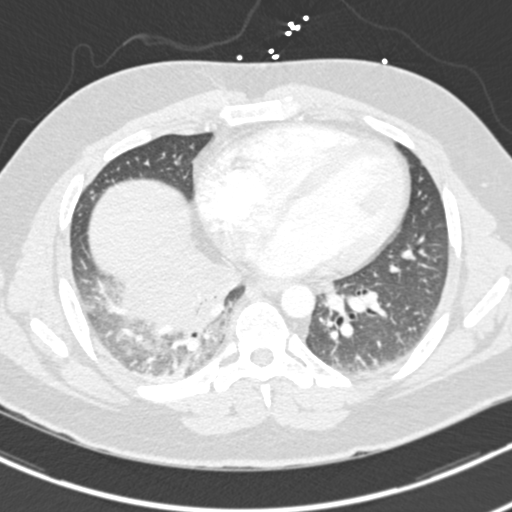
[im 95/237  mediastinal]
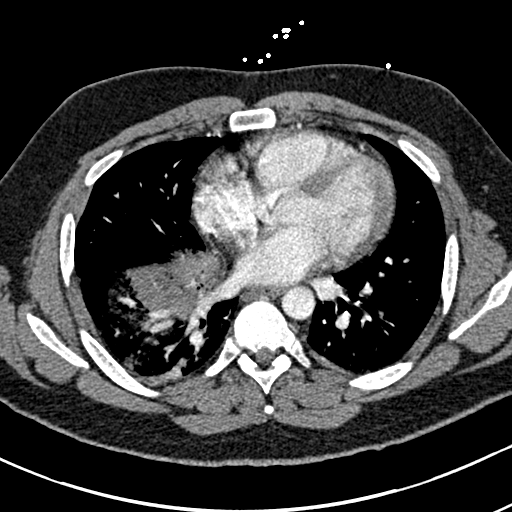
[im 107/237  lung]
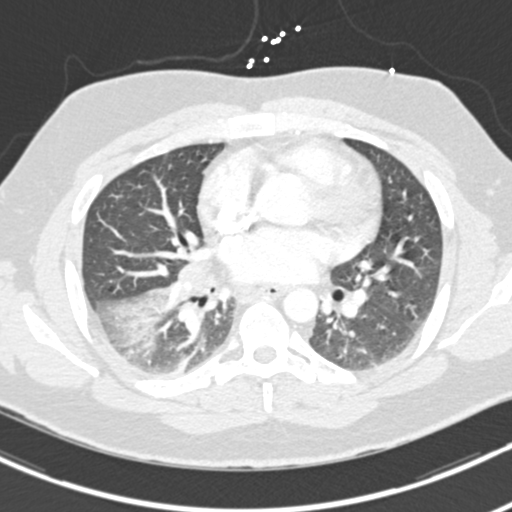
[im 119/237  mediastinal]
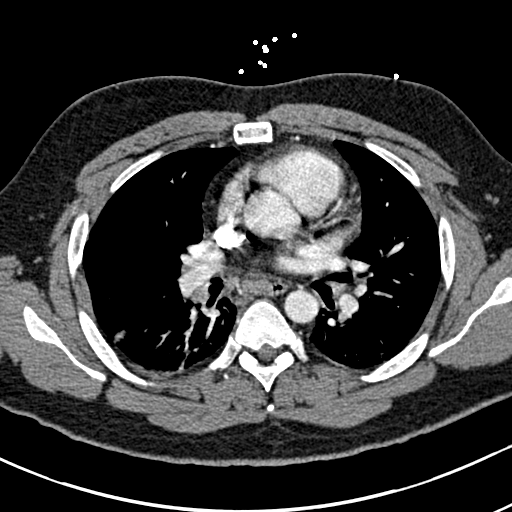
[im 130/237  lung]
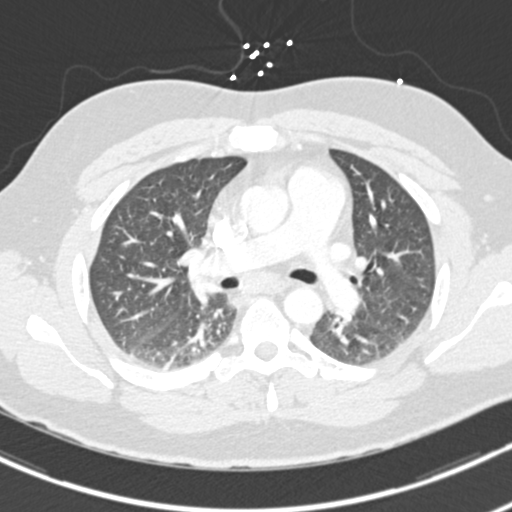
[im 142/237  mediastinal]
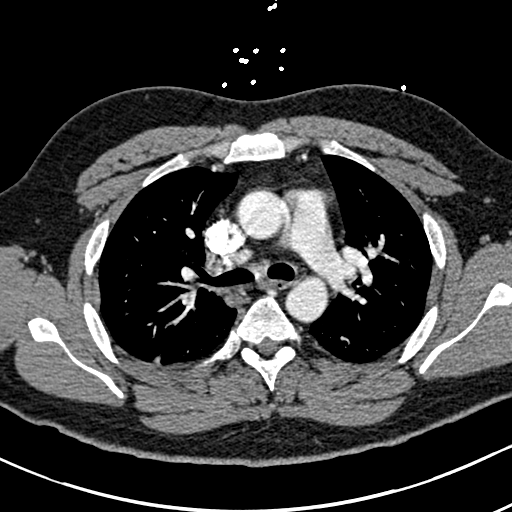
[im 154/237  lung]
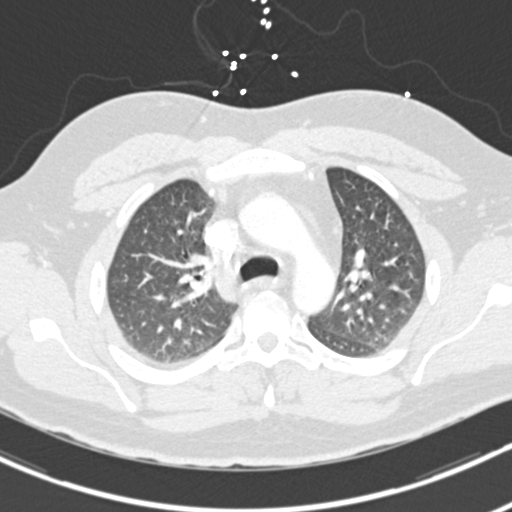
[im 158/237  mediastinal]
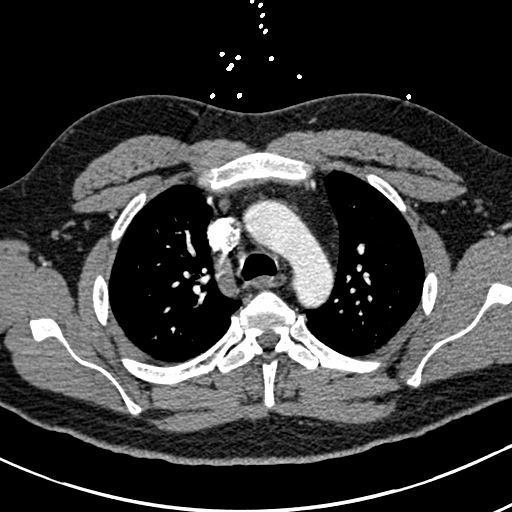
[im 166/237  lung]
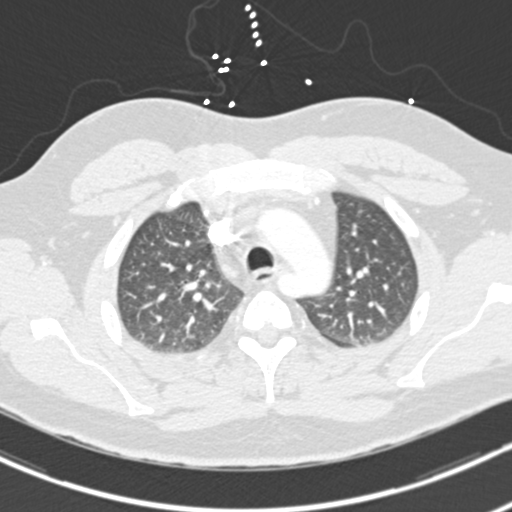
[im 178/237  mediastinal]
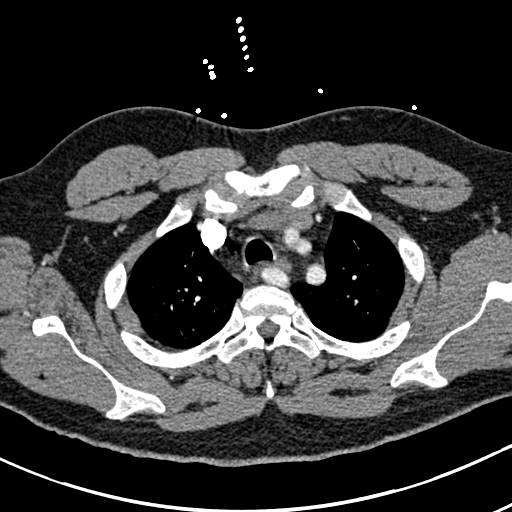
[im 201/237  lung]
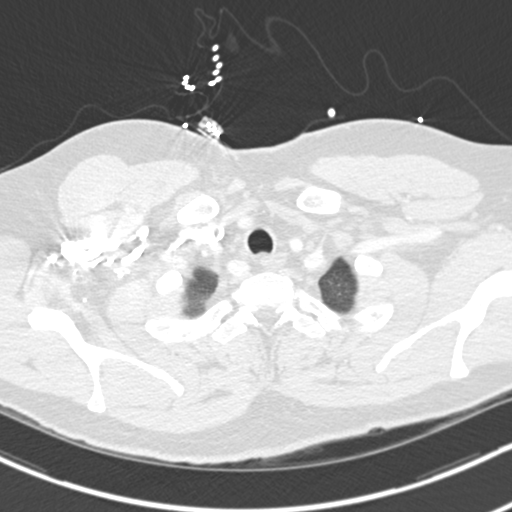
[im 213/237  mediastinal]
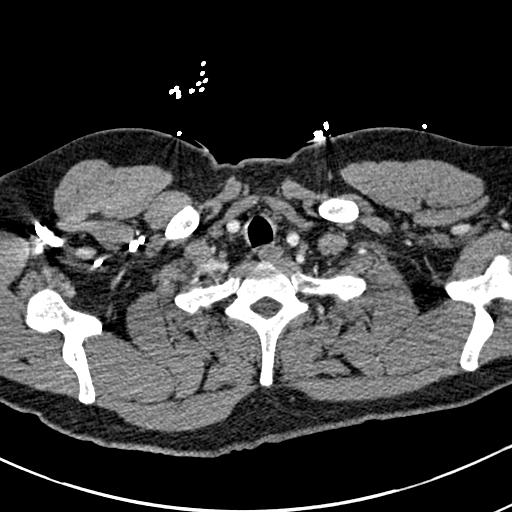
[im 225/237  lung]
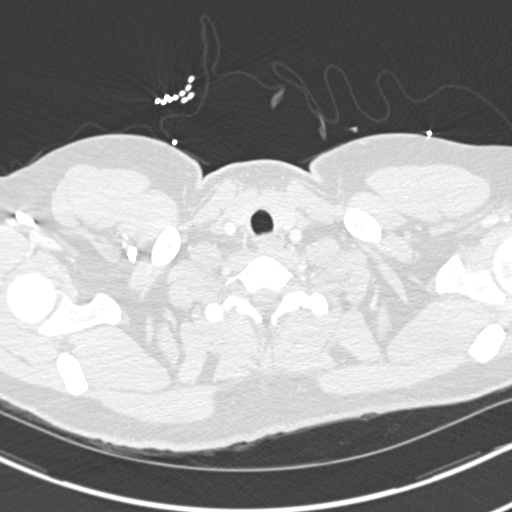

[19 of 32 positions shown; findings below may reference images not displayed]

FINDINGS: There are no filling defects in the pulmonary arterial tree to
suggest acute pulmonary thromboembolism

No pericardial effusion.

No pneumothorax or pleural effusion

Minimal patchy density at the base of the lingula.

There is extensive consolidation in the right lower lobe. Some air
bronchograms are present. There are other areas were there are no
air bronchograms. Overall density is somewhat less than expected for
consolidation. Right infrahilar adenopathy is suspected on image 46.
10 mm subcarinal lymph node.

No destructive bone lesion.

Review of the MIP images confirms the above findings.
IMPRESSION: No evidence of acute pulmonary thromboembolism

There is extensive consolidation in the right lower lobe. It is has
a somewhat unusual appearance with less than expected air
bronchograms and low density. It is also associated with mild right
hilar and subcarinal adenopathy. Malignancy would be unusual in this
age group. Eight should still be considered. Opportunistic infection
such as mycobacterium species and fungal disorder should be
suspected. Follow-up imaging by radiographs until complete
resolution is recommended. If abnormalities fail to resolve,
subsequent PET-CT may be helpful.

Mild patchy density at the base of the lingula.

## 2015-06-22 IMAGING — CR DG CHEST 2V
2 series · 2 of 2 positions shown · non-contrast
Comparison: Chest radiograph December 01, 2014 and chest CT
December 01, 2014

CLINICAL DATA: Cough and shortness of Breath; chest pain extending
into the right upper anterior chest. Recent documentation of right
lower lobe pneumonia.

EXAM:
CHEST  2 VIEW

[w chest pa]
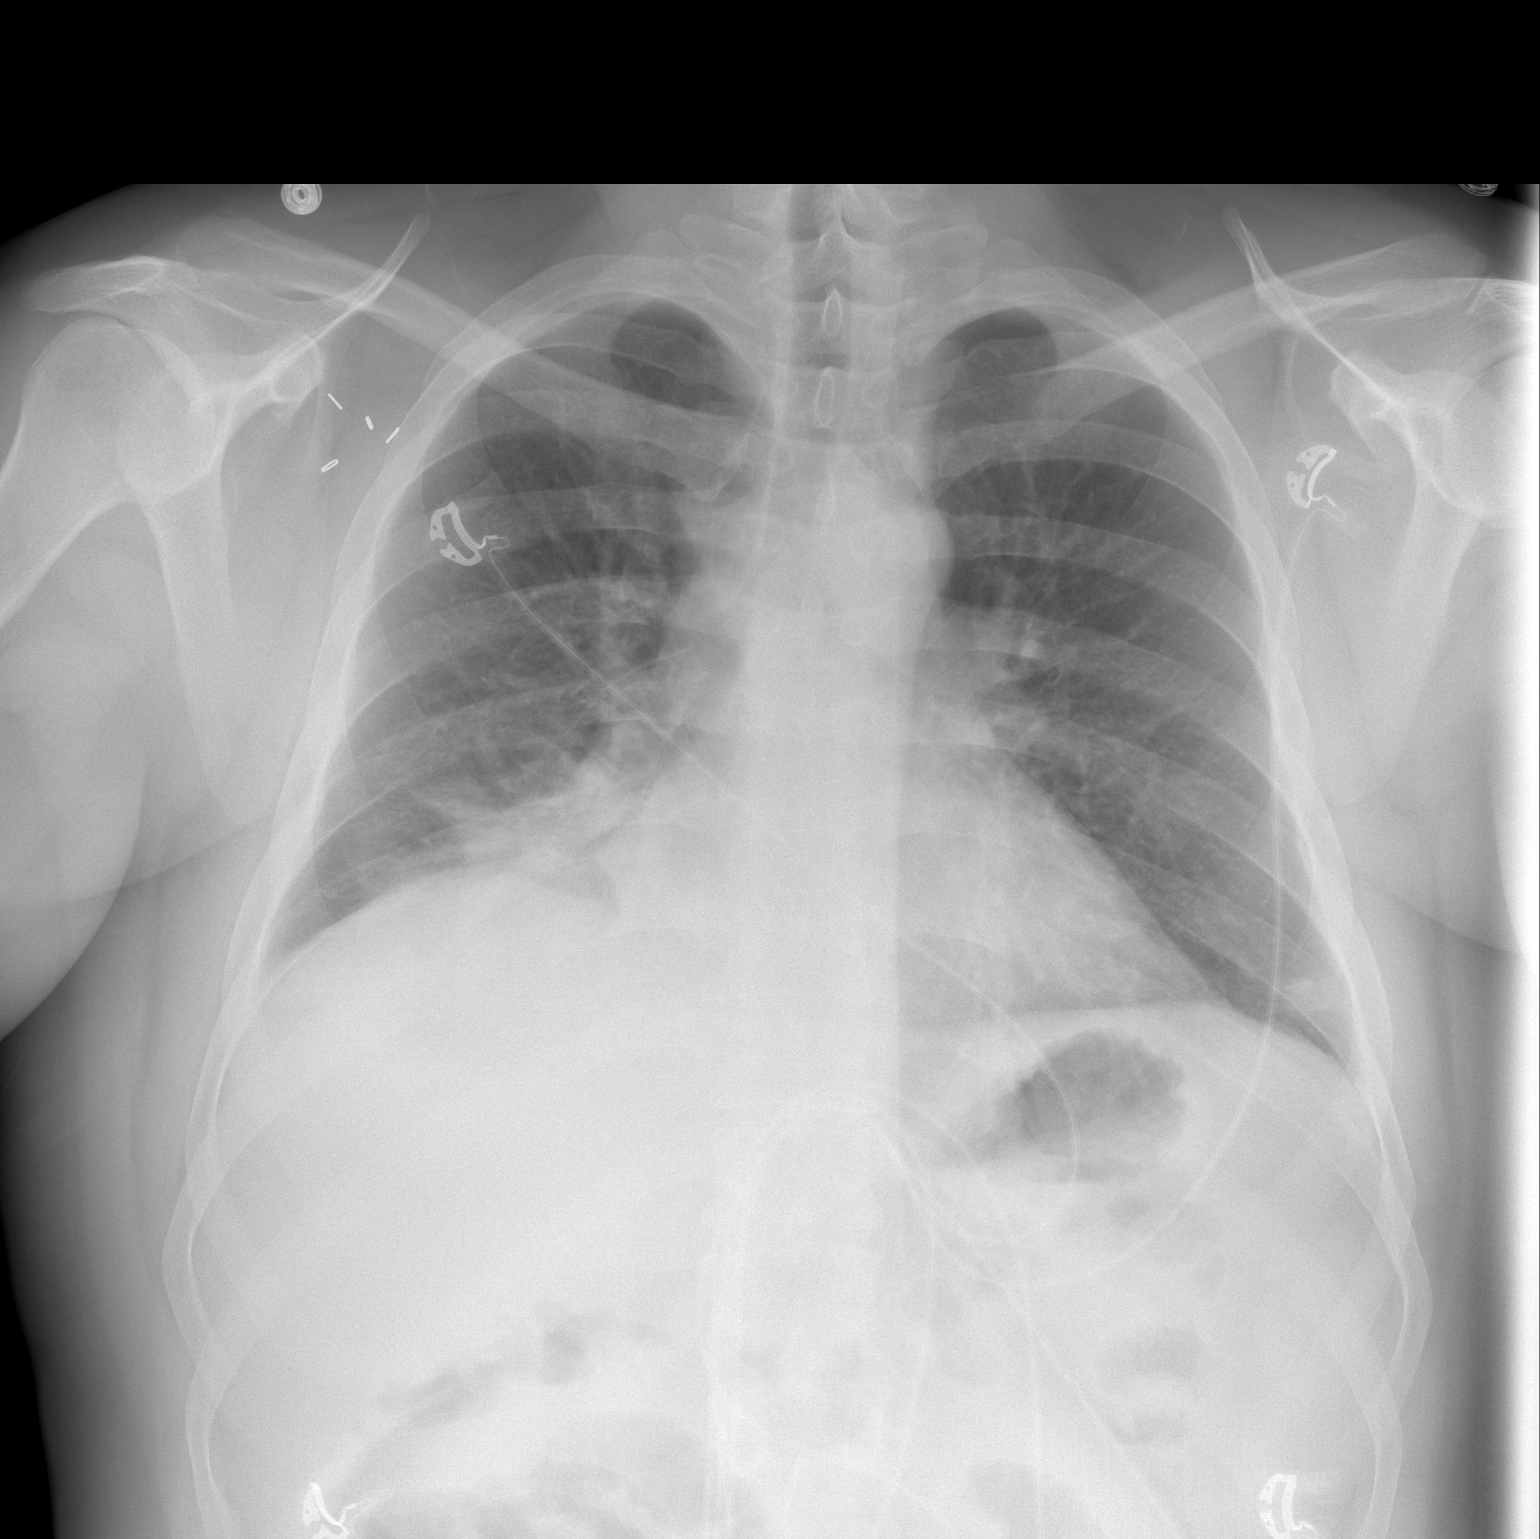

[w chest lat]
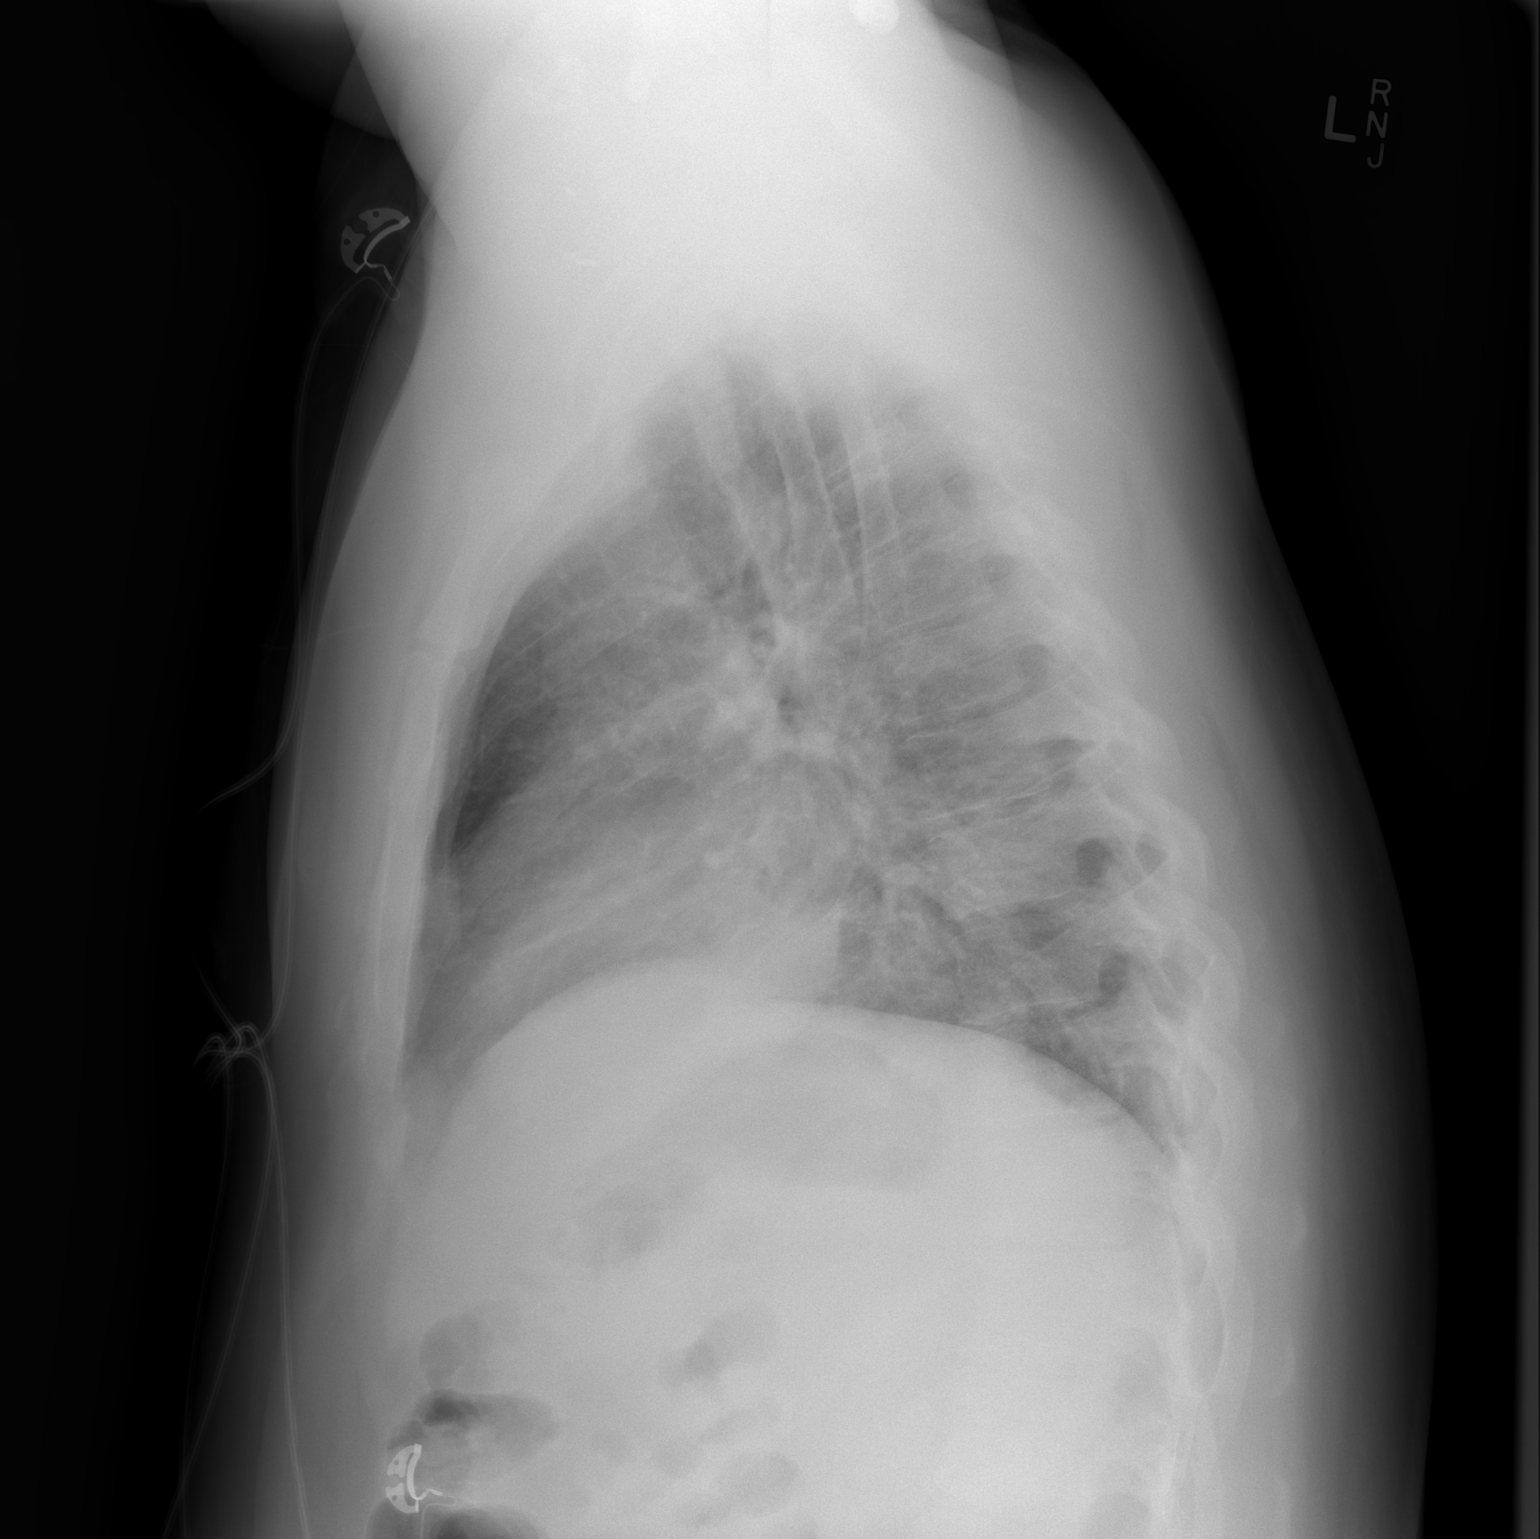

[2 of 2 positions shown; findings below may reference images not displayed]

FINDINGS: Right lower lobe pneumonia is again noted, increased compared to
recent studies. There is a small area of consolidation in the
lateral left base as well, stable. Lungs elsewhere clear. Heart size
and pulmonary vascularity are normal. No adenopathy. No bone
lesions. There are surgical clips in the right axillary region.
IMPRESSION: Increased consolidation right lower lobe. Stable focus of infiltrate
lateral left base. Lungs elsewhere clear. No change in cardiac
silhouette.

## 2018-04-05 ENCOUNTER — Telehealth: Payer: Self-pay | Admitting: Internal Medicine

## 2018-04-05 NOTE — Telephone Encounter (Signed)
Ok thx.
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
## 2018-04-06 NOTE — Telephone Encounter (Signed)
Appointment scheduled.

## 2018-04-19 ENCOUNTER — Ambulatory Visit: Payer: Managed Care, Other (non HMO) | Admitting: Internal Medicine

## 2018-04-19 DIAGNOSIS — Z0289 Encounter for other administrative examinations: Secondary | ICD-10-CM

## 2018-05-31 ENCOUNTER — Telehealth: Payer: Self-pay

## 2018-05-31 NOTE — Telephone Encounter (Signed)
Please advise   Copied from CRM [id number]. Topic: Appointment Scheduling - Scheduling Inquiry for Clinic >> May 31, 2018 12:16 PM Leafy RoRobinson, Norma J wrote: Reason for CRM: pt no showed his appt on 04-19-18 due to must be in court. Pt would like to reschedule. Can we resch this patient  >> May 31, 2018  1:58 PM Morphies, Hermine MessickCarson J wrote: Is it okay to reschedule this patient's new patient office visit with Dr Posey ReaPlotnikov? Please advise.

## 2018-06-01 NOTE — Telephone Encounter (Signed)
Left message for patient to call back to reschedule visit. Okay per Dr Posey ReaPlotnikov.

## 2018-06-01 NOTE — Telephone Encounter (Signed)
Okay to reschedule.

## 2018-06-01 NOTE — Telephone Encounter (Signed)
Ok Thx 

## 2021-02-27 NOTE — Progress Notes (Signed)
 Employment Screening

## 2022-01-22 NOTE — ED Provider Notes (Signed)
 ------------------------------------------------------------------------------- Attestation signed by Harlene LITTIE Sample, MD at 01/28/22 [phone] I was the attending physician on duty, and was available in Emergency Department for any consultations. The patient was managed by the [facility], I was not consulted on this patient -------------------------------------------------------------------------------   [facility]  ED Provider Note  Brian Friedman 36 y.o. male DOB: Jun 25, 1986 MRN: [id number] History   Chief Complaint  Patient presents with  . Chest Pain    Started around 0200 AM, was watching TV, feels shortness of breaths, like a burning [pain mid-sternal, left arm pain   Patient presents to the ED for Chest pain. Symptoms started around 2Am today while laying down in bed. Pain is in the anterior chest. +SOB. Denies N/V. +tingling in his left arm. Denies fever or cough. Did not take anything for his symptoms. Began feeling lightheaded at work so came here. No cardiac hx.   History provided by:  Patient Language interpreter used: No       Past Medical History:  Diagnosis Date  . Patient denies medical problems     Past Surgical History:  Procedure Laterality Date  . Coccyx fracture surgery     12 years ago, was playing sport and somebody fell on pt, had coccyx surgery  . Shoulder surgery Right    15 years ago    Social History   Substance and Sexual Activity  Alcohol Use None   Social History   Tobacco Use  Smoking Status Every Day  Smokeless Tobacco Not on file  Tobacco Comments   Vaping   E-Cigarettes  . Vaping Use    . Start Date    . Cartridges/Day    . Quit Date     Social History   Substance and Sexual Activity  Drug Use Not on file         Allergies  Allergen Reactions  . Penicillins Hives    Home Medications   No medications on file    Review of Systems   Review of Systems  Constitutional: Negative for  fever.  Respiratory: Positive for shortness of breath. Negative for cough.   Cardiovascular: Positive for chest pain.  Gastrointestinal: Negative for nausea and vomiting.  Neurological: Positive for light-headedness and numbness.  All other systems reviewed and are negative.   Physical Exam   ED Triage Vitals [01/22/22 1101]  BP (!) 175/116  Heart Rate 85  Resp 16  SpO2 97 %  Temp 97.4 F (36.3 C)    Physical Exam  Nursing note and vitals reviewed. Constitutional: He appears well-developed and well-nourished.  HENT:  Head: Normocephalic and atraumatic.  Eyes: Conjunctivae are normal.  Neck: Normal range of motion.  Cardiovascular: Normal rate, regular rhythm, normal heart sounds and intact distal pulses.  Pulmonary/Chest: Respiratory effort normal and breath sounds normal.  Musculoskeletal:     Cervical back: Normal range of motion.   Neurological: He is alert and oriented to person, place, and time.  Skin: Skin is warm. Skin is dry.    ED Course   Lab results:   CBC AND DIFFERENTIAL - Abnormal      Result Value   WBC 6.5     RBC 5.37     HGB 15.8     HCT 45.9     MCV 86     MCH 29.4     MCHC 34.4     Plt Ct 326     RDW SD 39.6     MPV 8.6 (*)  NRBC% 0.0     NRBC 0.000     NEUTROPHIL % 57.7     LYMPHOCYTE % 28.2     MONOCYTE % 9.6     Eosinophil % 3.1     BASOPHIL % 1.1     IG% 0.300     ABSOLUTE NEUTROPHIL COUNT 3.77     ABSOLUTE LYMPHOCYTE COUNT 1.8     MONO ABSOLUTE 0.6     EOS ABSOLUTE 0.2     BASO ABSOLUTE 0.1     IG ABSOLUTE 0.020    COMPREHENSIVE METABOLIC PANEL - Abnormal   Na 133 (*)    Potassium 4.0     Cl 101     CO2 21     AGAP 11     Glucose 102 (*)    BUN 17     Creatinine 1.05     Ca 9.9     ALK PHOS 59     T Bili 0.45     Total Protein 7.8     Alb 4.6     GLOBULIN 3.2     ALBUMIN/GLOBULIN RATIO 1.4     BUN/CREAT RATIO 16.2     ALT 21     AST 26     eGFR 95     Comment: Normal GFR (glomerular filtration rate) > 60  mL/min/1.73 meters squared, < 60 may include impaired kidney function. Calculation based on the Chronic Kidney Disease Epidemiology Collaboration (CK-EPI)equation refit without adjustment for race.  GEN5 CARDIAC TROPONIN T (TNT5) BASELINE - Normal   TnT-Gen5 (0hr) 6     Comment: An elevated Troponin indicates myocardial damage. Elevated troponin may also be due to pulmonary emboli, aortic dissection, heart failure, trauma, toxins and ischemia in the setting of critical illness.   PROTIME-INR - Normal   PT 12.3     Comment: Many commonly administered drugs may affect the results obtained in PT and PTT testing.   INR 0.9     Comment: INR Therapeutic Range for DVT, PE:      2.0 - 3.0 INR Mechanical Prosthetic Heart Valves: 2.5 - 3.5  MAGNESIUM - Normal   Mg 2.1    GEN5 CARDIAC TROPONIN T(TNT5) 1 HOUR - Normal   TnT-Gen5 (1hr) 6     Comment: An elevated Troponin indicates myocardial damage. Elevated troponin may also be due to pulmonary emboli, aortic dissection, heart failure, trauma, toxins and ischemia in the setting of critical illness.   Delta 1 Hour 0    GEN5 CARDIAC TROPONIN T(TNT5) 3 HOUR - Normal   TnT-Gen5 (3hr) 6     Comment: An elevated Troponin indicates myocardial damage. Elevated troponin may also be due to pulmonary emboli, aortic dissection, heart failure, trauma, toxins and ischemia in the setting of critical illness.   Delta 3 Hour 0    LIGHT BLUE TOP  GOLD SST    Imaging:   XR CHEST PA AND LATERAL   Narrative:    INDICATION: Chest pain  COMPARISON: None  TECHNIQUE: Chest 2 views.  FINDINGS: Normal heart size. The lungs and pleural spaces are clear. Thoracic spondylosis. Right axillary surgical clips.    Impression:    IMPRESSION: No active disease.  Electronically Signed by: Sheppard Charm on 01/22/2022 11:17 AM    ECG: ECG Results        ECG 12 lead (In process)  Result time 01/22/22 11:50:54   In process            Narrative:  Diagnosis Class  Normal Acquisition Device MV360 Ventricular Rate 95 Atrial Rate 95 P-R Interval 156 QRS Duration 100 Q-T Interval 346 QTC Calculation(Bazett) 434 Calculated P Axis 57 Calculated R Axis 81 Calculated T Axis 29  Diagnosis Normal sinus rhythm Normal ECG No previous ECGs available                   HEAR Score  History: Mix of high & low features ECG: Normal Age: Less than 45 yrs Risk Factors: No risk factors known HEAR Score Total: 1              Pre-Sedation Procedures    Medical Decision Making Patient stating he is ready to go back to work. Will complete Cardiac work up here. EKG NSR. CXR negative. No tachycardia or hypoxia.   Trop negative per TnTGen5 protocol. Patient is sitting up in bed on cell phone. NAD. Cardiology referral provided. Return precautions given.  Risk OTC drugs.        Provider Communication  New Prescriptions   No medications on file    Modified Medications   No medications on file    Discontinued Medications   No medications on file    Clinical Impression   Final diagnoses:  Chest pain, unspecified type    ED Disposition    ED Disposition  Discharge   Condition  Stable   Comment  --                Follow-up Information    [facility] Emergency Department.   Specialty: Emergency Medicine Comments: If symptoms worsen Contact information: [address] [place]  [address] [phone]       Ambulatory Referral to [facility] for ED 72 Hr Chest Pain F/U .   Comments: Chest pain, ACS ruled out in ED               Electronically signed by:   Shelda LOISE Fireman, PA 01/22/22 1516

## 2022-02-03 ENCOUNTER — Ambulatory Visit: Payer: Self-pay | Admitting: Cardiology

## 2022-02-03 DIAGNOSIS — R072 Precordial pain: Secondary | ICD-10-CM | POA: Insufficient documentation

## 2022-02-03 DIAGNOSIS — Z8249 Family history of ischemic heart disease and other diseases of the circulatory system: Secondary | ICD-10-CM | POA: Insufficient documentation

## 2022-02-03 NOTE — Progress Notes (Signed)
No show

## 2022-02-14 ENCOUNTER — Other Ambulatory Visit: Payer: Self-pay | Admitting: *Deleted

## 2022-02-14 DIAGNOSIS — Z8249 Family history of ischemic heart disease and other diseases of the circulatory system: Secondary | ICD-10-CM

## 2023-11-09 ENCOUNTER — Encounter (HOSPITAL_BASED_OUTPATIENT_CLINIC_OR_DEPARTMENT_OTHER): Payer: Self-pay | Admitting: Emergency Medicine

## 2023-11-09 ENCOUNTER — Other Ambulatory Visit: Payer: Self-pay

## 2023-11-09 ENCOUNTER — Ambulatory Visit (HOSPITAL_BASED_OUTPATIENT_CLINIC_OR_DEPARTMENT_OTHER)
Admission: EM | Admit: 2023-11-09 | Discharge: 2023-11-09 | Disposition: A | Discharge status: Home / Self Care | Payer: Self-pay | Attending: Internal Medicine | Admitting: Internal Medicine

## 2023-11-09 ENCOUNTER — Emergency Department (HOSPITAL_COMMUNITY): Admission: EM | Admit: 2023-11-09 | Discharge: 2023-11-10 | Discharge status: Against Medical Advice | Payer: Self-pay

## 2023-11-09 DIAGNOSIS — R55 Syncope and collapse: Secondary | ICD-10-CM

## 2023-11-09 DIAGNOSIS — T754XXA Electrocution, initial encounter: Secondary | ICD-10-CM

## 2023-11-09 DIAGNOSIS — S51811A Laceration without foreign body of right forearm, initial encounter: Secondary | ICD-10-CM
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
## 2023-11-10 NOTE — ED Notes (Signed)
Pt called for vitals with no answer.  

## 2023-12-21 ENCOUNTER — Emergency Department (HOSPITAL_BASED_OUTPATIENT_CLINIC_OR_DEPARTMENT_OTHER)
Admission: EM | Admit: 2023-12-21 | Discharge: 2023-12-21 | Disposition: A | Discharge status: Home / Self Care | Payer: Managed Care, Other (non HMO) | Attending: Emergency Medicine | Admitting: Emergency Medicine

## 2023-12-21 ENCOUNTER — Emergency Department (HOSPITAL_BASED_OUTPATIENT_CLINIC_OR_DEPARTMENT_OTHER): Payer: Managed Care, Other (non HMO)

## 2023-12-21 ENCOUNTER — Encounter (HOSPITAL_BASED_OUTPATIENT_CLINIC_OR_DEPARTMENT_OTHER): Payer: Self-pay | Admitting: Emergency Medicine

## 2023-12-21 ENCOUNTER — Other Ambulatory Visit: Payer: Self-pay

## 2023-12-21 DIAGNOSIS — R112 Nausea with vomiting, unspecified: Secondary | ICD-10-CM | POA: Insufficient documentation

## 2023-12-21 DIAGNOSIS — R1013 Epigastric pain: Secondary | ICD-10-CM | POA: Diagnosis present

## 2023-12-21 DIAGNOSIS — R197 Diarrhea, unspecified: Secondary | ICD-10-CM | POA: Diagnosis not present

## 2023-12-21 LAB — CBC WITH DIFFERENTIAL/PLATELET
Abs Immature Granulocytes: 0.04 10*3/uL (ref 0.00–0.07)
Basophils Absolute: 0 10*3/uL (ref 0.0–0.1)
Basophils Relative: 0 %
Eosinophils Absolute: 0.7 10*3/uL — ABNORMAL HIGH (ref 0.0–0.5)
Eosinophils Relative: 8 %
HCT: 51 % (ref 39.0–52.0)
Hemoglobin: 17.4 g/dL — ABNORMAL HIGH (ref 13.0–17.0)
Immature Granulocytes: 0 %
Lymphocytes Relative: 29 %
Lymphs Abs: 2.8 10*3/uL (ref 0.7–4.0)
MCH: 29.3 pg (ref 26.0–34.0)
MCHC: 34.1 g/dL (ref 30.0–36.0)
MCV: 85.9 fL (ref 80.0–100.0)
Monocytes Absolute: 0.6 10*3/uL (ref 0.1–1.0)
Monocytes Relative: 6 %
Neutro Abs: 5.4 10*3/uL (ref 1.7–7.7)
Neutrophils Relative %: 57 %
Platelets: 364 10*3/uL (ref 150–400)
RBC: 5.94 MIL/uL — ABNORMAL HIGH (ref 4.22–5.81)
RDW: 13.5 % (ref 11.5–15.5)
WBC: 9.6 10*3/uL (ref 4.0–10.5)
nRBC: 0 % (ref 0.0–0.2)

## 2023-12-21 LAB — COMPREHENSIVE METABOLIC PANEL
ALT: 17 U/L (ref 0–44)
AST: 28 U/L (ref 15–41)
Albumin: 4.6 g/dL (ref 3.5–5.0)
Alkaline Phosphatase: 54 U/L (ref 38–126)
Anion gap: 10 (ref 5–15)
BUN: 11 mg/dL (ref 6–20)
CO2: 25 mmol/L (ref 22–32)
Calcium: 9.3 mg/dL (ref 8.9–10.3)
Chloride: 103 mmol/L (ref 98–111)
Creatinine, Ser: 1.22 mg/dL (ref 0.61–1.24)
GFR, Estimated: >60 mL/min (ref >60)
Glucose, Bld: 91 mg/dL (ref 70–99)
Potassium: 3.8 mmol/L (ref 3.5–5.1)
Sodium: 138 mmol/L (ref 135–145)
Total Bilirubin: 0.5 mg/dL (ref 0.0–1.2)
Total Protein: 7.7 g/dL (ref 6.5–8.1)

## 2023-12-21 LAB — LIPASE, BLOOD: Lipase: 26 U/L (ref 11–51)

## 2023-12-21 MED ORDER — ONDANSETRON 4 MG PO TBDP
4.0000 mg | ORAL_TABLET | Freq: Once | ORAL | Status: AC
  Administered 2023-12-21: 4 mg via ORAL
  Filled 2023-12-21: qty 1

## 2023-12-21 MED ORDER — DICYCLOMINE HCL 20 MG PO TABS: 20.0000 mg | ORAL_TABLET | Freq: Two times a day (BID) | ORAL | 0 refills | Status: AC | PRN

## 2023-12-21 MED ORDER — PANTOPRAZOLE SODIUM 20 MG PO TBEC: 20.0000 mg | DELAYED_RELEASE_TABLET | Freq: Every day | ORAL | 0 refills | Status: AC

## 2023-12-21 MED ORDER — DICYCLOMINE HCL 10 MG PO CAPS
10.0000 mg | ORAL_CAPSULE | Freq: Once | ORAL | Status: AC
  Administered 2023-12-21: 10 mg via ORAL
  Filled 2023-12-21: qty 1

## 2023-12-21 MED ORDER — ALUM & MAG HYDROXIDE-SIMETH 200-200-20 MG/5ML PO SUSP
15.0000 mL | Freq: Once | ORAL | Status: AC
  Administered 2023-12-21: 15 mL via ORAL
  Filled 2023-12-21: qty 30

## 2023-12-21 MED ORDER — ONDANSETRON 4 MG PO TBDP: 4.0000 mg | ORAL_TABLET | Freq: Three times a day (TID) | ORAL | 0 refills | Status: AC | PRN

## 2023-12-21 MED ORDER — FAMOTIDINE 20 MG PO TABS
20.0000 mg | ORAL_TABLET | Freq: Once | ORAL | Status: AC
  Administered 2023-12-21: 20 mg via ORAL
  Filled 2023-12-21: qty 1

## 2023-12-21 NOTE — Discharge Instructions (Signed)
As discussed, workup today overall reassuring.  Ultrasound did not show inflammation of your gallbladder or pancreas your your kidneys.  Other labs appeared well.  Suspect you could have GERD versus gastritis versus gastric ulcers versus delayed gastric emptying.  Treat this similarly.  Will send you home with medications for reflux in the form of Protonix.  Will give you medicine called Bentyl to take as needed for abdominal discomfort as well as Zofran to take as needed for nausea.  Have sent in a referral for GI for reassessment of your symptoms.  Please do not hesitate to return to emergency department for worrisome signs and symptoms we discussed become apparent.

## 2023-12-21 NOTE — ED Notes (Signed)
Pt given discharge instructions and reviewed prescriptions. Opportunities given for questions. Pt verbalizes understanding. PIV removed x1. Stone,Heather R, RN 

## 2023-12-21 NOTE — ED Provider Triage Note (Signed)
Emergency Medicine Provider Triage Evaluation Note  Brian Friedman , a 37 y.o. male  was evaluated in triage.  Pt complains of ruq abd pain  Review of Systems  Positive: Nausea reflux Negative: fever  Physical Exam  BP (!) 152/107 (BP Location: Right Arm)   Pulse 97   Temp 97.7 F (36.5 C)   Resp 17   Ht 5\' 11"  (1.803 m)   Wt 105.8 kg   SpO2 99%   BMI 32.53 kg/m  Gen:   Awake, no distress   Resp:  Normal effort  MSK:   Moves extremities without difficulty  Other:    Medical Decision Making  Medically screening exam initiated at 2:31 PM.  Appropriate orders placed.  Brian Friedman was informed that the remainder of the evaluation will be completed by another provider, this initial triage assessment does not replace that evaluation, and the importance of remaining in the ED until their evaluation is complete.     Elson Areas, [place] 12/21/23 1431

## 2023-12-21 NOTE — ED Triage Notes (Signed)
Pt via pov from home with RUQ pain x 10 days. Pt reports that every time he eats, his stomach starts hurting and he vomits and/or has diarrhea. Pt alert & oriented, nad noted.

## 2023-12-21 NOTE — ED Provider Notes (Signed)
[place] EMERGENCY DEPARTMENT AT [facility] Provider Note   CSN: [id number] Arrival date & time: 12/21/23  1333     History  Chief Complaint  Patient presents with   Abdominal Pain    Brian Friedman is a 37 y.o. male.   Abdominal Pain   37 year old male presents emergency department with complaints of abdominal pain, nausea, vomiting, diarrhea.  Patient reports symptoms present for the past 10 days or so.  Reports pain in the upper middle/right upper abdomen without radiation.  Reports symptoms worsened after he eats and at night when he lays down flat.  Denies any fever, urinary symptoms.  Does report looser bowel movements over the same amount of time described as light brown without overt diarrhea but states that they have been softer than normal.  Was seen at telehealth visit and prescribed reflux medicine which she states has not been helping very much.  States that he now has experienced some burning type sensation in his esophagus."  Denies history of similar symptoms in the past.  Past medical history significant for hyperlipidemia, family history of CAD and early  Home Medications Prior to Admission medications   Medication Sig Start Date End Date Taking? Authorizing Provider  dicyclomine (BENTYL) 20 MG tablet Take 1 tablet (20 mg total) by mouth 2 (two) times daily as needed for spasms (abdominal spasms). 12/21/23  Yes Sherian Maroon A, PA  ondansetron (ZOFRAN-ODT) 4 MG disintegrating tablet Take 1 tablet (4 mg total) by mouth every 8 (eight) hours as needed. 12/21/23  Yes Sherian Maroon A, PA  pantoprazole (PROTONIX) 20 MG tablet Take 1 tablet (20 mg total) by mouth daily. 12/21/23  Yes Sherian Maroon A, PA  acetaminophen (TYLENOL) 500 MG tablet Take 500 mg by mouth every 6 (six) hours as needed for fever.    [provider]  aspirin-acetaminophen-caffeine (EXCEDRIN MIGRAINE) [phone] MG per tablet Take 1 tablet by mouth every 6 (six) hours as  needed for headache.    [provider]  benzonatate (TESSALON) 200 MG capsule Take 1 capsule (200 mg total) by mouth 3 (three) times daily. 12/06/14   Dhungel, Nishant, MD  chlorpheniramine-HYDROcodone (TUSSIONEX) 10-8 MG/5ML LQCR Take 5 mLs by mouth every 12 (twelve) hours. 12/06/14   Dhungel, Theda Belfast, MD  DM-Doxylamine-Acetaminophen (VICKS NYQUIL COLD & FLU) 15-6.25-325 MG CAPS Take 2 capsules by mouth every 6 (six) hours.    [provider]  naphazoline (NAPHCON) 0.1 % ophthalmic solution Place 1 drop into both eyes 4 (four) times daily as needed for irritation. 12/06/14   Dhungel, Theda Belfast, MD  ofloxacin (OCUFLOX) 0.3 % ophthalmic solution Place 1 drop into both eyes 4 (four) times daily. 12/06/14   Dhungel, Theda Belfast, MD      Allergies    Other and Penicillins    Review of Systems   Review of Systems  Gastrointestinal:  Positive for abdominal pain.  All other systems reviewed and are negative.   Physical Exam Updated Vital Signs BP (!) 152/107 (BP Location: Right Arm)   Pulse 97   Temp 97.7 F (36.5 C)   Resp 17   Ht 5\' 11"  (1.803 m)   Wt 105.8 kg   SpO2 99%   BMI 32.53 kg/m  Physical Exam Vitals and nursing note reviewed.  Constitutional:      General: He is not in acute distress.    Appearance: He is well-developed.  HENT:     Head: Normocephalic and atraumatic.  Eyes:     Conjunctiva/sclera: Conjunctivae  normal.  Cardiovascular:     Rate and Rhythm: Normal rate and regular rhythm.     Heart sounds: No murmur heard. Pulmonary:     Effort: Pulmonary effort is normal. No respiratory distress.     Breath sounds: Normal breath sounds.  Abdominal:     Palpations: Abdomen is soft.     Tenderness: There is abdominal tenderness in the epigastric area. There is no guarding. Negative signs include Murphy's sign and McBurney's sign.     Comments: Tenderness to T palpation epigastric region just right of midline upper abdomen.  Musculoskeletal:         General: No swelling.     Cervical back: Neck supple.  Skin:    General: Skin is warm and dry.     Capillary Refill: Capillary refill takes less than 2 seconds.  Neurological:     Mental Status: He is alert.  Psychiatric:        Mood and Affect: Mood normal.     ED Results / Procedures / Treatments   Labs (all labs ordered are listed, but only abnormal results are displayed) Labs Reviewed  CBC WITH DIFFERENTIAL/PLATELET - Abnormal; Notable for the following components:      Result Value   RBC 5.94 (*)    Hemoglobin 17.4 (*)    Eosinophils Absolute 0.7 (*)    All other components within normal limits  COMPREHENSIVE METABOLIC PANEL  LIPASE, BLOOD    EKG None  Radiology US Abdomen Complete Result Date: 12/21/2023 CLINICAL DATA:  Right upper quadrant pain for 10 days EXAM: ABDOMEN ULTRASOUND COMPLETE COMPARISON:  None Available. FINDINGS: Gallbladder: No gallstones or wall thickening visualized. No sonographic Murphy sign noted by sonographer. Common bile duct: Diameter: 4 mm Liver: Diffusely echogenic hepatic parenchyma consistent with fatty liver infiltration. With this level of echogenicity evaluation for underlying mass lesion is limited and if needed follow-up contrast CT or MRI as clinically appropriate. Portal vein is patent on color Doppler imaging with normal direction of blood flow towards the liver. IVC: No abnormality visualized. Pancreas: Visualized portion unremarkable. Spleen: Spleen measures 12.8 cm in length, upper limits of normal. Right Kidney: Length: 11.2 cm. Echogenicity within normal limits. No mass or hydronephrosis visualized. Left Kidney: Length: 11.5 cm. Echogenicity within normal limits. No mass or hydronephrosis visualized. Abdominal aorta: No aneurysm visualized. Other findings: None. IMPRESSION: Fatty liver infiltration.  Borderline size spleen. No gallstones or ductal dilatation. Electronically Signed   By: Karen Kays M.D.   On: 12/21/2023 16:03     Procedures Procedures    Medications Ordered in ED Medications  ondansetron (ZOFRAN-ODT) disintegrating tablet 4 mg (4 mg Oral Given 12/21/23 1608)  dicyclomine (BENTYL) capsule 10 mg (10 mg Oral Given 12/21/23 1608)  alum & mag hydroxide-simeth (MAALOX/MYLANTA) 200-200-20 MG/5ML suspension 15 mL (15 mLs Oral Given 12/21/23 1608)  famotidine (PEPCID) tablet 20 mg (20 mg Oral Given 12/21/23 1608)    ED Course/ Medical Decision Making/ A&P                                 Medical Decision Making Risk OTC drugs. Prescription drug management.   This patient presents to the ED for concern of abdominal pain, this involves an extensive number of treatment options, and is a complaint that carries with it a high risk of complications and morbidity.  The differential diagnosis includes gastritis, PUD, pancreatitis, cholecystitis, CBD pathology, SBO/LBO, volvulus, diverticulitis, appendicitis, pyelonephritis,  nephrolithiasis, cystitis, other   Co morbidities that complicate the patient evaluation  See HPI   Additional history obtained:  Additional history obtained from EMR External records from outside source obtained and reviewed including hospital periods   Lab Tests:  I Ordered, and personally interpreted labs.  The pertinent results include: No leukocytosis.  Polycythemia with a hemoglobin of 17.4.  Platelets within range.  No lecture abnormalities.  No transaminitis.  No renal dysfunction.  Lipase within normal limits.   Imaging Studies ordered:  I ordered imaging studies including ultrasound abdomen I independently visualized and interpreted imaging which showed no acute abnormalities I agree with the radiologist interpretation   Cardiac Monitoring: / EKG:  The patient was maintained on a cardiac monitor.  I personally viewed and interpreted the cardiac monitored which showed an underlying rhythm of: Sinus with him   Consultations Obtained:  N/a   Problem  List / ED Course / Critical interventions / Medication management  Epigastric abdominal pain I ordered medication including Zofran, Pepcid, Bentyl, Maalox   Reevaluation of the patient after these medicines showed that the patient improved I have reviewed the patients home medicines and have made adjustments as needed   Social Determinants of Health:  Denies tobacco, licit drug use   Test / Admission - Considered:  Epigastric abdominal pain  Vitals signs significant for hypertension blood, 152/107. Otherwise within normal range and stable throughout visit. Laboratory/imaging studies significant for: See above Patient reports epigastric/right upper quadrant pain for the past 10 days or so.  Reports pain is exacerbated when he is lying down at night as well as postprandially.  On exam, tenderness epigastric region as well as just right of midline and upper abdomen.  Murphy sign negative.  Labs reassuring.  Ultrasound of the abdomen showed no acute abnormality.  Treated with GI cocktail as well as antiemetic and noted improvement of symptoms.  Suspect gastric etiology of symptoms of other gastritis, peptic ulcer disease, gastroparesis, other.  Recommend continued use of similar medications in the outpatient setting for treatment of symptoms of follow-up with GI specialist in the outpatient setting.  Lifestyle modifications also recommended as described in AVS.  Treatment plan discussed at length with patient and he acknowledged understanding was agreeable to said plan.  Patient will well-appearing, afebrile in no acute distress, tolerating p.o. without difficulty. Worrisome signs and symptoms were discussed with the patient, and the patient acknowledged understanding to return to the ED if noticed. Patient was stable upon discharge.          Final Clinical Impression(s) / ED Diagnoses Final diagnoses:  Epigastric pain    Rx / DC Orders ED Discharge Orders          Ordered     dicyclomine (BENTYL) 20 MG tablet  2 times daily PRN        12/21/23 1629    pantoprazole (PROTONIX) 20 MG tablet  Daily        12/21/23 1629    ondansetron (ZOFRAN-ODT) 4 MG disintegrating tablet  Every 8 hours PRN        12/21/23 1629    Ambulatory referral to Gastroenterology        12/21/23 1629              Peter Garter, PA 12/22/23 1104    Melene Plan, DO 12/22/23 1234

## 2024-01-27 ENCOUNTER — Encounter: Payer: Self-pay | Admitting: Emergency Medicine

## 2024-09-18 ENCOUNTER — Encounter (HOSPITAL_COMMUNITY): Payer: Self-pay | Admitting: Emergency Medicine

## 2024-09-18 ENCOUNTER — Emergency Department (HOSPITAL_COMMUNITY)

## 2024-09-18 ENCOUNTER — Other Ambulatory Visit: Payer: Self-pay

## 2024-09-18 ENCOUNTER — Emergency Department (HOSPITAL_COMMUNITY): Admission: EM | Admit: 2024-09-18 | Discharge: 2024-09-18 | Discharge status: Against Medical Advice

## 2024-09-18 DIAGNOSIS — R109 Unspecified abdominal pain: Secondary | ICD-10-CM | POA: Insufficient documentation

## 2024-09-18 DIAGNOSIS — F1010 Alcohol abuse, uncomplicated: Secondary | ICD-10-CM | POA: Insufficient documentation

## 2024-09-18 DIAGNOSIS — M545 Low back pain, unspecified: Secondary | ICD-10-CM | POA: Insufficient documentation

## 2024-09-18 DIAGNOSIS — J45909 Unspecified asthma, uncomplicated: Secondary | ICD-10-CM | POA: Insufficient documentation

## 2024-09-18 DIAGNOSIS — Z5329 Procedure and treatment not carried out because of patient's decision for other reasons: Secondary | ICD-10-CM | POA: Diagnosis not present

## 2024-09-18 DIAGNOSIS — R000 Tachycardia, unspecified: Secondary | ICD-10-CM | POA: Diagnosis not present

## 2024-09-18 DIAGNOSIS — M542 Cervicalgia: Secondary | ICD-10-CM | POA: Insufficient documentation

## 2024-09-18 DIAGNOSIS — Z7982 Long term (current) use of aspirin: Secondary | ICD-10-CM | POA: Diagnosis not present

## 2024-09-18 DIAGNOSIS — Y92410 Unspecified street and highway as the place of occurrence of the external cause: Secondary | ICD-10-CM | POA: Insufficient documentation

## 2024-09-18 DIAGNOSIS — R400 Somnolence: Secondary | ICD-10-CM | POA: Insufficient documentation

## 2024-09-18 DIAGNOSIS — S0181XA Laceration without foreign body of other part of head, initial encounter: Secondary | ICD-10-CM | POA: Insufficient documentation

## 2024-09-18 DIAGNOSIS — Y908 Blood alcohol level of 240 mg/100 ml or more: Secondary | ICD-10-CM | POA: Insufficient documentation

## 2024-09-18 DIAGNOSIS — M546 Pain in thoracic spine: Secondary | ICD-10-CM | POA: Diagnosis not present

## 2024-09-18 LAB — I-STAT CHEM 8, ED
BUN: 11 mg/dL (ref 6–20)
Calcium, Ion: 1 mmol/L — ABNORMAL LOW (ref 1.15–1.40)
Chloride: 109 mmol/L (ref 98–111)
Creatinine, Ser: 1.4 mg/dL — ABNORMAL HIGH (ref 0.61–1.24)
Glucose, Bld: 106 mg/dL — ABNORMAL HIGH (ref 70–99)
HCT: 49 % (ref 39.0–52.0)
Hemoglobin: 16.7 g/dL (ref 13.0–17.0)
Potassium: 3.8 mmol/L (ref 3.5–5.1)
Sodium: 141 mmol/L (ref 135–145)
TCO2: 20 mmol/L — ABNORMAL LOW (ref 22–32)

## 2024-09-18 LAB — COMPREHENSIVE METABOLIC PANEL WITH GFR
ALT: 18 U/L (ref 0–44)
AST: 26 U/L (ref 15–41)
Albumin: 3.9 g/dL (ref 3.5–5.0)
Alkaline Phosphatase: 45 U/L (ref 38–126)
Anion gap: 13 (ref 5–15)
BUN: 10 mg/dL (ref 6–20)
CO2: 21 mmol/L — ABNORMAL LOW (ref 22–32)
Calcium: 8.6 mg/dL — ABNORMAL LOW (ref 8.9–10.3)
Chloride: 104 mmol/L (ref 98–111)
Creatinine, Ser: 1.18 mg/dL (ref 0.61–1.24)
GFR, Estimated: >60 mL/min (ref >60)
Glucose, Bld: 100 mg/dL — ABNORMAL HIGH (ref 70–99)
Potassium: 3.5 mmol/L (ref 3.5–5.1)
Sodium: 138 mmol/L (ref 135–145)
Total Bilirubin: 0.4 mg/dL (ref 0.0–1.2)
Total Protein: 7.3 g/dL (ref 6.5–8.1)

## 2024-09-18 LAB — CBC
HCT: 45.8 % (ref 39.0–52.0)
Hemoglobin: 15.9 g/dL (ref 13.0–17.0)
MCH: 29.2 pg (ref 26.0–34.0)
MCHC: 34.7 g/dL (ref 30.0–36.0)
MCV: 84.2 fL (ref 80.0–100.0)
Platelets: 338 K/uL (ref 150–400)
RBC: 5.44 MIL/uL (ref 4.22–5.81)
RDW: 13.2 % (ref 11.5–15.5)
WBC: 8.3 K/uL (ref 4.0–10.5)
nRBC: 0 % (ref 0.0–0.2)

## 2024-09-18 LAB — URINALYSIS, ROUTINE W REFLEX MICROSCOPIC
Bilirubin Urine: NEGATIVE
Glucose, UA: NEGATIVE mg/dL
Hgb urine dipstick: NEGATIVE
Ketones, ur: NEGATIVE mg/dL
Leukocytes,Ua: NEGATIVE
Nitrite: NEGATIVE
Protein, ur: NEGATIVE mg/dL
Specific Gravity, Urine: 1.011 (ref 1.005–1.030)
pH: 5 (ref 5.0–8.0)

## 2024-09-18 LAB — PROTIME-INR
INR: 1 (ref 0.8–1.2)
Prothrombin Time: 13.4 s (ref 11.4–15.2)

## 2024-09-18 LAB — SAMPLE TO BLOOD BANK

## 2024-09-18 LAB — I-STAT CG4 LACTIC ACID, ED: Lactic Acid, Venous: 2 mmol/L (ref 0.5–1.9)

## 2024-09-18 LAB — ETHANOL: Alcohol, Ethyl (B): 266 mg/dL — ABNORMAL HIGH (ref <15)

## 2024-09-18 MED ORDER — LIDOCAINE-EPINEPHRINE (PF) 2 %-1:200000 IJ SOLN
10.0000 mL | Freq: Once | INTRAMUSCULAR | Status: DC
  Filled 2024-09-18: qty 20

## 2024-09-18 MED ORDER — LACTATED RINGERS IV BOLUS: 1000.0000 mL | Freq: Once | INTRAVENOUS | Status: DC

## 2024-09-18 MED ORDER — FENTANYL CITRATE PF 50 MCG/ML IJ SOSY
50.0000 ug | PREFILLED_SYRINGE | Freq: Once | INTRAMUSCULAR | Status: AC
  Administered 2024-09-18: 50 ug via INTRAVENOUS
  Filled 2024-09-18: qty 1

## 2024-09-18 MED ORDER — IOHEXOL 350 MG/ML SOLN
75.0000 mL | Freq: Once | INTRAVENOUS | Status: AC | PRN
Start: 2024-09-18 — End: 2024-09-18
  Administered 2024-09-18: 75 mL via INTRAVENOUS

## 2024-09-18 MED ORDER — AMOXICILLIN-POT CLAVULANATE 875-125 MG PO TABS
1.0000 | ORAL_TABLET | Freq: Two times a day (BID) | ORAL | 0 refills | Status: AC
Start: 2024-09-18 — End: ?

## 2024-09-18 MED ORDER — ONDANSETRON HCL 4 MG/2ML IJ SOLN
4.0000 mg | Freq: Once | INTRAMUSCULAR | Status: AC
  Administered 2024-09-18: 4 mg via INTRAVENOUS
  Filled 2024-09-18: qty 2

## 2024-09-18 MED ORDER — AMOXICILLIN-POT CLAVULANATE 875-125 MG PO TABS
1.0000 | ORAL_TABLET | Freq: Once | ORAL | Status: AC
  Administered 2024-09-18: 1 via ORAL
  Filled 2024-09-18: qty 1

## 2024-09-18 NOTE — Discharge Instructions (Addendum)
 You decided to leave before your workup was complete.  We discussed that if you have any significant injury this could lead to bad outcomes including death.  Your MRI had not been done at this point and given your symptoms when he came in there is concern for spinal cord injury.  He understood this and did not want to wait for this.  I have sent antibiotic into the pharmacy for you. Return for any emergent symptoms.  Take Tylenol  and ibuprofen for your pain control.
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
## 2024-09-18 NOTE — ED Notes (Signed)
 Patient attempting to get out of bed, stating that his back hurts too bad.  Patient assisted back to bed, head raised for comfort.  C-collar removed by EDPA PTA.
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
## 2024-09-18 NOTE — ED Notes (Signed)
 Patient to CT.
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
## 2024-09-18 NOTE — Progress Notes (Signed)
 Orthopedic Tech Progress Note Patient Details:  Brian Friedman 14-Aug-1986 [id number]  Patient ID: Brian Friedman, male   DOB: [birth year], 38 y.o.   MRN: [id number] I attended trauma page. Brian Friedman 09/18/2024, 8:35 PM

## 2024-09-18 NOTE — ED Notes (Signed)
 Patient's mother called and given update over the phone with permission from patient.

## 2024-09-18 NOTE — ED Notes (Signed)
 Muscle tone weak in lower extremities

## 2024-09-18 NOTE — ED Notes (Signed)
 [place] j c collar placed

## 2024-09-18 NOTE — ED Notes (Signed)
 Patient requesting to leave AMA.  PA at bedside to explain the risks of leaving to patient, patient verbalized understanding.  Patient left with steady gait, with mother.
